# Patient Record
Sex: Male | Born: 1950 | Race: White | Hispanic: No | Smoking: Never smoker
Health system: Southern US, Community
[De-identification: ages and names within clinical notes are randomized; demographics above are authoritative.]

## PROBLEM LIST (undated history)

## (undated) DIAGNOSIS — I10 Essential (primary) hypertension: Secondary | ICD-10-CM

## (undated) DIAGNOSIS — F419 Anxiety disorder, unspecified: Secondary | ICD-10-CM

## (undated) HISTORY — DX: Anxiety disorder, unspecified: F41.9

---

## 2006-01-15 ENCOUNTER — Ambulatory Visit: Payer: Self-pay | Admitting: Unknown Physician Specialty

## 2006-08-10 ENCOUNTER — Ambulatory Visit: Payer: Self-pay | Admitting: Internal Medicine

## 2006-12-21 ENCOUNTER — Ambulatory Visit: Payer: Self-pay

## 2011-03-11 ENCOUNTER — Ambulatory Visit: Payer: Self-pay | Admitting: Surgery

## 2011-03-18 ENCOUNTER — Ambulatory Visit: Payer: Self-pay | Admitting: Surgery

## 2012-01-01 ENCOUNTER — Ambulatory Visit: Payer: Self-pay | Admitting: Unknown Physician Specialty

## 2014-02-26 ENCOUNTER — Ambulatory Visit: Payer: Self-pay

## 2014-04-13 ENCOUNTER — Ambulatory Visit: Payer: Self-pay | Admitting: Internal Medicine

## 2014-07-08 NOTE — Op Note (Signed)
PATIENT NAME:  Newman PiesSLEY, Bernabe B MR#:  [id number] DATE OF BIRTH:  10/02/1950  DATE OF PROCEDURE:  03/18/2011  PREOPERATIVE DIAGNOSIS: Left inguinal hernia.   POSTOPERATIVE DIAGNOSIS: Left inguinal hernia.   PROCEDURE: Left inguinal hernia repair.   SURGEON: Adella HareJ. Wilton Smith, MD  ANESTHESIA: General.   INDICATIONS: This 64 year old male has a history of pain and bulging in the left groin. He has a long-standing hydrocele. A left inguinal hernia was demonstrated on physical exam and repair recommended for definitive treatment.   DESCRIPTION OF PROCEDURE: The patient was placed on the operating table in the supine position under general anesthesia. The abdomen was prepared with ChloraPrep, draped in a sterile manner.   A left lower quadrant transversely oriented suprapubic incision was made, carried down through subcutaneous tissues. Several small bleeding points were cauterized. One traversing vein was divided between 4-0 chromic ligatures. Scarpa's fascia was incised. The external oblique aponeurosis was incised along the course of its fibers to open the external ring and expose the inguinal cord structures. The ilioinguinal nerve was seen coursing medially. Cremaster fibers were spread and also the cord structures were further mobilized and demonstrated a direct inguinal hernia. This was separated from the cord structures. The cremaster fibers were further spread exposing a cord lipoma but there was no indirect component of the hernia. Next, a circumferential dissection was made in the attenuated transversalis fascia which was excised. Next, the sac was inverted. The floor of the inguinal canal was repaired with a row of 0 Surgilon sutures suturing the edges of the transversalis fascia and also suturing the conjoined tendon to the shelving edge of the inguinal ligament completely obliterating the direct inguinal hernia defect. There was no tension on the repair. Next the two small cord lipomas were  dissected free from surrounding structures and suture ligated with 4-0 chromic individually and amputated. Did not need to send any tissue for pathology. Next, the cord structures and ilioinguinal nerve were replaced along the floor of the inguinal canal. Cut edges of the external oblique aponeurosis were closed with a running 4-0 Vicryl suture. The deep fascia superior and lateral to the repair site was infiltrated with 0.5% Sensorcaine with epinephrine, also subcutaneous tissues were infiltrated. Scarpa's fascia was closed with interrupted 4-0 Vicryl. The skin was closed with a running 4-0 chromic subcuticular suture and Dermabond.   The patient tolerated surgery satisfactorily and was then prepared for transfer to the recovery room.  ____________________________ Shela CommonsJ. Renda RollsWilton Smith, MD jws:cms D: 03/18/2011 08:46:50 ET T: 03/18/2011 12:41:56 ET JOB#: [id number]  cc: Adella HareJ. Wilton Smith, MD, <Dictator> Adella HareWILTON J SMITH MD ELECTRONICALLY SIGNED 04/15/2011 18:13

## 2015-04-04 ENCOUNTER — Other Ambulatory Visit: Payer: Self-pay | Admitting: Internal Medicine

## 2015-04-04 DIAGNOSIS — R911 Solitary pulmonary nodule: Secondary | ICD-10-CM

## 2015-04-12 ENCOUNTER — Ambulatory Visit
Admission: RE | Admit: 2015-04-12 | Discharge: 2015-04-12 | Disposition: A | Discharge status: Home / Self Care | Payer: BLUE CROSS/BLUE SHIELD | Source: Ambulatory Visit | Attending: Internal Medicine | Admitting: Internal Medicine

## 2015-04-12 DIAGNOSIS — N2889 Other specified disorders of kidney and ureter: Secondary | ICD-10-CM | POA: Insufficient documentation

## 2015-04-12 DIAGNOSIS — M4185 Other forms of scoliosis, thoracolumbar region: Secondary | ICD-10-CM | POA: Insufficient documentation

## 2015-04-12 DIAGNOSIS — R911 Solitary pulmonary nodule: Secondary | ICD-10-CM | POA: Insufficient documentation

## 2015-04-12 HISTORY — DX: Essential (primary) hypertension: I10

## 2015-04-12 MED ORDER — IOPAMIDOL (ISOVUE-300) INJECTION 61%: 75.0000 mL | Freq: Once | INTRAVENOUS | Status: DC | PRN

## 2015-04-12 MED ORDER — IOHEXOL 300 MG/ML  SOLN
75.0000 mL | Freq: Once | INTRAMUSCULAR | Status: AC | PRN
  Administered 2015-04-12: 75 mL via INTRAVENOUS

## 2015-04-16 ENCOUNTER — Other Ambulatory Visit: Payer: Self-pay | Admitting: Internal Medicine

## 2015-04-16 DIAGNOSIS — N2889 Other specified disorders of kidney and ureter: Secondary | ICD-10-CM

## 2015-05-06 ENCOUNTER — Ambulatory Visit
Admission: RE | Admit: 2015-05-06 | Discharge: 2015-05-06 | Disposition: A | Discharge status: Home / Self Care | Payer: BLUE CROSS/BLUE SHIELD | Source: Ambulatory Visit | Attending: Internal Medicine | Admitting: Internal Medicine

## 2015-05-06 DIAGNOSIS — N2889 Other specified disorders of kidney and ureter: Secondary | ICD-10-CM | POA: Diagnosis not present

## 2015-09-13 ENCOUNTER — Other Ambulatory Visit
Admission: RE | Admit: 2015-09-13 | Discharge: 2015-09-13 | Disposition: A | Discharge status: Home / Self Care | Payer: BLUE CROSS/BLUE SHIELD | Source: Ambulatory Visit | Attending: Student | Admitting: Student

## 2015-09-13 DIAGNOSIS — R197 Diarrhea, unspecified: Secondary | ICD-10-CM | POA: Insufficient documentation

## 2015-09-13 LAB — C DIFFICILE QUICK SCREEN W PCR REFLEX
C DIFFICILE (CDIFF) INTERP: NEGATIVE
C DIFFICILE (CDIFF) TOXIN: NEGATIVE
C Diff antigen: NEGATIVE

## 2015-09-14 LAB — MISC LABCORP TEST (SEND OUT): LABCORP TEST CODE: 183480

## 2016-04-29 ENCOUNTER — Ambulatory Visit: Payer: Self-pay | Admitting: Podiatry

## 2016-10-16 ENCOUNTER — Other Ambulatory Visit: Payer: Self-pay | Admitting: Internal Medicine

## 2016-10-16 DIAGNOSIS — M7989 Other specified soft tissue disorders: Secondary | ICD-10-CM

## 2016-10-22 ENCOUNTER — Ambulatory Visit
Admission: RE | Admit: 2016-10-22 | Discharge: 2016-10-22 | Disposition: A | Discharge status: Home / Self Care | Payer: BLUE CROSS/BLUE SHIELD | Source: Ambulatory Visit | Attending: Internal Medicine | Admitting: Internal Medicine

## 2016-10-22 DIAGNOSIS — M7989 Other specified soft tissue disorders: Secondary | ICD-10-CM | POA: Diagnosis not present

## 2017-02-23 ENCOUNTER — Other Ambulatory Visit: Payer: Self-pay | Admitting: Internal Medicine

## 2017-02-23 DIAGNOSIS — I712 Thoracic aortic aneurysm, without rupture, unspecified: Secondary | ICD-10-CM

## 2017-03-04 ENCOUNTER — Ambulatory Visit
Admission: RE | Admit: 2017-03-04 | Discharge: 2017-03-04 | Disposition: A | Discharge status: Home / Self Care | Payer: BLUE CROSS/BLUE SHIELD | Source: Ambulatory Visit | Attending: Internal Medicine | Admitting: Internal Medicine

## 2017-03-04 DIAGNOSIS — I712 Thoracic aortic aneurysm, without rupture, unspecified: Secondary | ICD-10-CM

## 2017-03-04 DIAGNOSIS — K8020 Calculus of gallbladder without cholecystitis without obstruction: Secondary | ICD-10-CM | POA: Insufficient documentation

## 2017-03-04 LAB — POCT I-STAT CREATININE: Creatinine, Ser: 1 mg/dL (ref 0.61–1.24)

## 2017-03-04 MED ORDER — IOPAMIDOL (ISOVUE-370) INJECTION 76%
75.0000 mL | Freq: Once | INTRAVENOUS | Status: AC | PRN
Start: 2017-03-04 — End: 2017-03-04
  Administered 2017-03-04: 75 mL via INTRAVENOUS

## 2017-07-20 DIAGNOSIS — Z860101 Personal history of adenomatous and serrated colon polyps: Secondary | ICD-10-CM | POA: Insufficient documentation

## 2017-07-20 DIAGNOSIS — Z86010 Personal history of colonic polyps: Secondary | ICD-10-CM | POA: Insufficient documentation

## 2017-11-02 DIAGNOSIS — I10 Essential (primary) hypertension: Secondary | ICD-10-CM | POA: Insufficient documentation

## 2018-05-06 ENCOUNTER — Other Ambulatory Visit: Payer: Self-pay | Admitting: Internal Medicine

## 2018-05-06 DIAGNOSIS — I712 Thoracic aortic aneurysm, without rupture, unspecified: Secondary | ICD-10-CM

## 2018-05-25 ENCOUNTER — Ambulatory Visit
Admission: RE | Admit: 2018-05-25 | Discharge: 2018-05-25 | Disposition: A | Discharge status: Home / Self Care | Payer: BLUE CROSS/BLUE SHIELD | Source: Ambulatory Visit | Attending: Internal Medicine | Admitting: Internal Medicine

## 2018-05-25 ENCOUNTER — Other Ambulatory Visit: Payer: Self-pay

## 2018-05-25 DIAGNOSIS — I712 Thoracic aortic aneurysm, without rupture, unspecified: Secondary | ICD-10-CM

## 2018-05-25 LAB — POCT I-STAT CREATININE: Creatinine, Ser: 0.9 mg/dL (ref 0.61–1.24)

## 2018-05-25 MED ORDER — IOPAMIDOL (ISOVUE-370) INJECTION 76%
100.0000 mL | Freq: Once | INTRAVENOUS | Status: AC | PRN
  Administered 2018-05-25: 100 mL via INTRAVENOUS

## 2018-05-25 MED ORDER — IOHEXOL 350 MG/ML SOLN: 100.0000 mL | Freq: Once | INTRAVENOUS | Status: DC | PRN

## 2018-05-27 ENCOUNTER — Other Ambulatory Visit: Payer: Self-pay | Admitting: Internal Medicine

## 2018-05-27 DIAGNOSIS — N2889 Other specified disorders of kidney and ureter: Secondary | ICD-10-CM

## 2018-06-06 ENCOUNTER — Other Ambulatory Visit: Payer: Self-pay

## 2018-06-06 ENCOUNTER — Ambulatory Visit
Admission: RE | Admit: 2018-06-06 | Discharge: 2018-06-06 | Disposition: A | Discharge status: Home / Self Care | Payer: BLUE CROSS/BLUE SHIELD | Source: Ambulatory Visit | Attending: Internal Medicine | Admitting: Internal Medicine

## 2018-06-06 DIAGNOSIS — N2889 Other specified disorders of kidney and ureter: Secondary | ICD-10-CM | POA: Insufficient documentation

## 2018-11-03 DIAGNOSIS — I10 Essential (primary) hypertension: Secondary | ICD-10-CM | POA: Diagnosis not present

## 2018-11-03 DIAGNOSIS — Z131 Encounter for screening for diabetes mellitus: Secondary | ICD-10-CM | POA: Diagnosis not present

## 2018-11-03 DIAGNOSIS — K5100 Ulcerative (chronic) pancolitis without complications: Secondary | ICD-10-CM | POA: Diagnosis not present

## 2018-11-03 DIAGNOSIS — E7800 Pure hypercholesterolemia, unspecified: Secondary | ICD-10-CM | POA: Diagnosis not present

## 2018-11-10 DIAGNOSIS — K5100 Ulcerative (chronic) pancolitis without complications: Secondary | ICD-10-CM | POA: Diagnosis not present

## 2018-11-14 ENCOUNTER — Other Ambulatory Visit: Payer: Self-pay

## 2018-11-14 DIAGNOSIS — K51019 Ulcerative (chronic) pancolitis with unspecified complications: Secondary | ICD-10-CM

## 2018-11-14 NOTE — Progress Notes (Signed)
Scott Bautista had a visit with Dawson Bills, NP at [facility] on 11/10/2018.  She requested the following labs:  CBC, BMP, HFP, Sed Rate, C-Reactive Protien & Quantiferon - TB Gold.  AMD

## 2018-11-16 LAB — BASIC METABOLIC PANEL
BUN/Creatinine Ratio: 20 (ref 10–24)
BUN: 18 mg/dL (ref 8–27)
CO2: 20 mmol/L (ref 20–29)
Calcium: 9.5 mg/dL (ref 8.6–10.2)
Chloride: 103 mmol/L (ref 96–106)
Creatinine, Ser: 0.9 mg/dL (ref 0.76–1.27)
GFR calc Af Amer: 102 mL/min/{1.73_m2} (ref >59)
GFR calc non Af Amer: 88 mL/min/{1.73_m2} (ref >59)
Glucose: 102 mg/dL — ABNORMAL HIGH (ref 65–99)
Potassium: 4.2 mmol/L (ref 3.5–5.2)
Sodium: 140 mmol/L (ref 134–144)

## 2018-11-16 LAB — CBC WITH DIFFERENTIAL/PLATELET
Basophils Absolute: 0 10*3/uL (ref 0.0–0.2)
Basos: 0 %
EOS (ABSOLUTE): 0.1 10*3/uL (ref 0.0–0.4)
Eos: 2 %
Hematocrit: 45.2 % (ref 37.5–51.0)
Hemoglobin: 15.6 g/dL (ref 13.0–17.7)
Immature Grans (Abs): 0 10*3/uL (ref 0.0–0.1)
Immature Granulocytes: 0 %
Lymphocytes Absolute: 2.6 10*3/uL (ref 0.7–3.1)
Lymphs: 37 %
MCH: 31.5 pg (ref 26.6–33.0)
MCHC: 34.5 g/dL (ref 31.5–35.7)
MCV: 91 fL (ref 79–97)
Monocytes Absolute: 0.6 10*3/uL (ref 0.1–0.9)
Monocytes: 9 %
Neutrophils Absolute: 3.6 10*3/uL (ref 1.4–7.0)
Neutrophils: 52 %
Platelets: 234 10*3/uL (ref 150–450)
RBC: 4.96 x10E6/uL (ref 4.14–5.80)
RDW: 13 % (ref 11.6–15.4)
WBC: 6.8 10*3/uL (ref 3.4–10.8)

## 2018-11-16 LAB — C-REACTIVE PROTEIN: CRP: 1 mg/L (ref 0–10)

## 2018-11-16 LAB — QUANTIFERON-TB GOLD PLUS
QuantiFERON Mitogen Value: >10 IU/mL
QuantiFERON Nil Value: 0.01 IU/mL
QuantiFERON TB1 Ag Value: 0.02 IU/mL
QuantiFERON TB2 Ag Value: 0.01 IU/mL
QuantiFERON-TB Gold Plus: NEGATIVE

## 2018-11-16 LAB — HEPATIC FUNCTION PANEL
ALT: 16 IU/L (ref 0–44)
AST: 21 IU/L (ref 0–40)
Albumin: 4.5 g/dL (ref 3.8–4.8)
Alkaline Phosphatase: 50 IU/L (ref 39–117)
Bilirubin Total: 1.2 mg/dL (ref 0.0–1.2)
Bilirubin, Direct: 0.27 mg/dL (ref 0.00–0.40)
Total Protein: 6.7 g/dL (ref 6.0–8.5)

## 2018-11-16 LAB — SEDIMENTATION RATE: Sed Rate: 20 mm/hr (ref 0–30)

## 2018-11-30 DIAGNOSIS — L570 Actinic keratosis: Secondary | ICD-10-CM | POA: Diagnosis not present

## 2018-11-30 DIAGNOSIS — Z09 Encounter for follow-up examination after completed treatment for conditions other than malignant neoplasm: Secondary | ICD-10-CM | POA: Diagnosis not present

## 2018-11-30 DIAGNOSIS — Z85828 Personal history of other malignant neoplasm of skin: Secondary | ICD-10-CM | POA: Diagnosis not present

## 2018-11-30 DIAGNOSIS — Z872 Personal history of diseases of the skin and subcutaneous tissue: Secondary | ICD-10-CM | POA: Diagnosis not present

## 2018-11-30 DIAGNOSIS — Z08 Encounter for follow-up examination after completed treatment for malignant neoplasm: Secondary | ICD-10-CM | POA: Diagnosis not present

## 2018-12-30 ENCOUNTER — Ambulatory Visit: Payer: Self-pay

## 2018-12-30 ENCOUNTER — Other Ambulatory Visit: Payer: Self-pay

## 2018-12-30 DIAGNOSIS — Z9229 Personal history of other drug therapy: Secondary | ICD-10-CM

## 2019-02-13 ENCOUNTER — Ambulatory Visit: Payer: Self-pay

## 2019-02-13 ENCOUNTER — Other Ambulatory Visit: Payer: Self-pay

## 2019-02-13 VITALS — BP 130/78

## 2019-02-13 DIAGNOSIS — Z0130 Encounter for examination of blood pressure without abnormal findings: Secondary | ICD-10-CM

## 2019-02-13 NOTE — Progress Notes (Signed)
Presents requesting blood pressure check.  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  AMD

## 2019-04-07 ENCOUNTER — Ambulatory Visit: Payer: Self-pay

## 2019-04-07 ENCOUNTER — Other Ambulatory Visit: Payer: Self-pay

## 2019-04-07 DIAGNOSIS — Z0000 Encounter for general adult medical examination without abnormal findings: Secondary | ICD-10-CM

## 2019-04-07 NOTE — Progress Notes (Signed)
Appt scheduled for mid February 2021 for annual physical with Dr. Aram Beecham of [facility]  Also, requested for lab results to be sent to Harmon Dun, PA-C of [facility].  AMD

## 2019-04-08 LAB — CMP12+LP+TP+TSH+6AC+PSA+CBC…
ALT: 15 IU/L (ref 0–44)
AST: 25 IU/L (ref 0–40)
Albumin/Globulin Ratio: 2 (ref 1.2–2.2)
Albumin: 4.5 g/dL (ref 3.8–4.8)
Alkaline Phosphatase: 50 IU/L (ref 39–117)
BUN/Creatinine Ratio: 18 (ref 10–24)
BUN: 18 mg/dL (ref 8–27)
Basophils Absolute: 0 10*3/uL (ref 0.0–0.2)
Basos: 0 %
Bilirubin Total: 1.3 mg/dL — ABNORMAL HIGH (ref 0.0–1.2)
Chloride: 103 mmol/L (ref 96–106)
Chol/HDL Ratio: 2.9 ratio (ref 0.0–5.0)
Cholesterol, Total: 149 mg/dL (ref 100–199)
Creatinine, Ser: 1.02 mg/dL (ref 0.76–1.27)
EOS (ABSOLUTE): 0.2 10*3/uL (ref 0.0–0.4)
Eos: 3 %
Estimated CHD Risk: <0.5 times avg. (ref 0.0–1.0)
Free Thyroxine Index: 2.1 (ref 1.2–4.9)
GFR calc Af Amer: 88 mL/min/{1.73_m2} (ref >59)
GFR calc non Af Amer: 76 mL/min/{1.73_m2} (ref >59)
GGT: 15 IU/L (ref 0–65)
Globulin, Total: 2.3 g/dL (ref 1.5–4.5)
Glucose: 90 mg/dL (ref 65–99)
HDL: 52 mg/dL (ref >39)
Hematocrit: 49.2 % (ref 37.5–51.0)
Immature Grans (Abs): 0 10*3/uL (ref 0.0–0.1)
Immature Granulocytes: 0 %
Iron: 117 ug/dL (ref 38–169)
LDH: 157 IU/L (ref 121–224)
LDL Chol Calc (NIH): 75 mg/dL (ref 0–99)
Lymphocytes Absolute: 2.4 10*3/uL (ref 0.7–3.1)
Lymphs: 40 %
MCH: 29.9 pg (ref 26.6–33.0)
MCHC: 32.5 g/dL (ref 31.5–35.7)
MCV: 92 fL (ref 79–97)
Monocytes Absolute: 0.4 10*3/uL (ref 0.1–0.9)
Monocytes: 7 %
Neutrophils Absolute: 3 10*3/uL (ref 1.4–7.0)
Neutrophils: 50 %
Phosphorus: 3.2 mg/dL (ref 2.8–4.1)
Platelets: 260 10*3/uL (ref 150–450)
Potassium: 4.5 mmol/L (ref 3.5–5.2)
Prostate Specific Ag, Serum: 0.3 ng/mL (ref 0.0–4.0)
RBC: 5.36 x10E6/uL (ref 4.14–5.80)
RDW: 13.1 % (ref 11.6–15.4)
Sodium: 142 mmol/L (ref 134–144)
T3 Uptake Ratio: 29 % (ref 24–39)
T4, Total: 7.4 ug/dL (ref 4.5–12.0)
TSH: 0.87 u[IU]/mL (ref 0.450–4.500)
Total Protein: 6.8 g/dL (ref 6.0–8.5)
Triglycerides: 127 mg/dL (ref 0–149)
Uric Acid: 5.4 mg/dL (ref 3.8–8.4)
VLDL Cholesterol Cal: 22 mg/dL (ref 5–40)
WBC: 6 10*3/uL (ref 3.4–10.8)

## 2019-04-08 LAB — URINALYSIS, ROUTINE W REFLEX MICROSCOPIC
Bilirubin, UA: NEGATIVE
Glucose, UA: NEGATIVE
Ketones, UA: NEGATIVE
Leukocytes,UA: NEGATIVE
Nitrite, UA: NEGATIVE
Protein,UA: NEGATIVE
RBC, UA: NEGATIVE
Specific Gravity, UA: 1.019 (ref 1.005–1.030)
Urobilinogen, Ur: 0.2 mg/dL (ref 0.2–1.0)
pH, UA: 6.5 (ref 5.0–7.5)

## 2019-04-08 LAB — CMP12+LP+TP+TSH+6AC+PSA+CBC?
Calcium: 9.1 mg/dL (ref 8.6–10.2)
Hemoglobin: 16 g/dL (ref 13.0–17.7)

## 2019-04-08 LAB — HGB A1C W/O EAG: Hgb A1c MFr Bld: 5.6 % (ref 4.8–5.6)

## 2019-05-11 DIAGNOSIS — I10 Essential (primary) hypertension: Secondary | ICD-10-CM | POA: Diagnosis not present

## 2019-05-11 DIAGNOSIS — E7800 Pure hypercholesterolemia, unspecified: Secondary | ICD-10-CM | POA: Diagnosis not present

## 2019-05-11 DIAGNOSIS — Z0000 Encounter for general adult medical examination without abnormal findings: Secondary | ICD-10-CM | POA: Diagnosis not present

## 2019-05-11 DIAGNOSIS — K5100 Ulcerative (chronic) pancolitis without complications: Secondary | ICD-10-CM | POA: Diagnosis not present

## 2019-05-31 ENCOUNTER — Other Ambulatory Visit (INDEPENDENT_AMBULATORY_CARE_PROVIDER_SITE_OTHER): Payer: Self-pay | Admitting: Nurse Practitioner

## 2019-05-31 DIAGNOSIS — I8390 Asymptomatic varicose veins of unspecified lower extremity: Secondary | ICD-10-CM

## 2019-06-02 ENCOUNTER — Other Ambulatory Visit: Payer: Self-pay

## 2019-06-02 ENCOUNTER — Encounter (INDEPENDENT_AMBULATORY_CARE_PROVIDER_SITE_OTHER): Payer: Self-pay | Admitting: Nurse Practitioner

## 2019-06-02 ENCOUNTER — Ambulatory Visit (INDEPENDENT_AMBULATORY_CARE_PROVIDER_SITE_OTHER): Payer: 59 | Admitting: Nurse Practitioner

## 2019-06-02 ENCOUNTER — Ambulatory Visit (INDEPENDENT_AMBULATORY_CARE_PROVIDER_SITE_OTHER): Payer: 59

## 2019-06-02 VITALS — BP 144/95 | HR 68 | Ht 75.0 in | Wt 225.0 lb

## 2019-06-02 DIAGNOSIS — M1990 Unspecified osteoarthritis, unspecified site: Secondary | ICD-10-CM | POA: Insufficient documentation

## 2019-06-02 DIAGNOSIS — E785 Hyperlipidemia, unspecified: Secondary | ICD-10-CM | POA: Insufficient documentation

## 2019-06-02 DIAGNOSIS — I83812 Varicose veins of left lower extremities with pain: Secondary | ICD-10-CM

## 2019-06-02 DIAGNOSIS — M5137 Other intervertebral disc degeneration, lumbosacral region: Secondary | ICD-10-CM | POA: Insufficient documentation

## 2019-06-02 DIAGNOSIS — I10 Essential (primary) hypertension: Secondary | ICD-10-CM

## 2019-06-02 DIAGNOSIS — Z8669 Personal history of other diseases of the nervous system and sense organs: Secondary | ICD-10-CM | POA: Insufficient documentation

## 2019-06-02 DIAGNOSIS — N433 Hydrocele, unspecified: Secondary | ICD-10-CM | POA: Insufficient documentation

## 2019-06-02 DIAGNOSIS — I8390 Asymptomatic varicose veins of unspecified lower extremity: Secondary | ICD-10-CM | POA: Diagnosis not present

## 2019-06-02 DIAGNOSIS — Z8719 Personal history of other diseases of the digestive system: Secondary | ICD-10-CM | POA: Insufficient documentation

## 2019-06-02 DIAGNOSIS — K5190 Ulcerative colitis, unspecified, without complications: Secondary | ICD-10-CM | POA: Insufficient documentation

## 2019-06-07 ENCOUNTER — Encounter (INDEPENDENT_AMBULATORY_CARE_PROVIDER_SITE_OTHER): Payer: Self-pay | Admitting: Nurse Practitioner

## 2019-06-07 NOTE — Progress Notes (Signed)
SUBJECTIVE:  Patient ID: Scott Bautista, male    DOB: 1950-07-14, 69 y.o.   MRN: [id number] Chief Complaint  Patient presents with  . New Patient (Initial Visit)    Varicose veins BLE VEN Reflux LS    HPI  Scott Bautista is a 69 y.o. male The patient is seen for evaluation of symptomatic varicose veins of the left lower extremity. The patient relates burning and stinging which worsened steadily throughout the course of the day, particularly with standing. The patient also notes an aching and throbbing pain over the varicosities, particularly with prolonged dependent positions. The symptoms are significantly improved with elevation.  The patient also notes that during hot weather the symptoms are greatly intensified. The patient states the pain from the varicose veins interferes with work, daily exercise, shopping and household maintenance. At this point, the symptoms are persistent and severe enough that they're having a negative impact on lifestyle and are interfering with daily activities.  Previously an approximately 1975 the patient had the vein stripped from his right lower extremity.  The patient has previously had sclerotherapy done to his left lower extremity approximately 12 to 13 years ago per previous records.  There is no history of DVT, PE or superficial thrombophlebitis. There is no history of ulceration or hemorrhage. The patient denies a significant family history of varicose veins.   The patient has worn graduated compression in the past and currently still does. At the present time the patient has been using over-the-counter analgesics.  Today the patient underwent noninvasive studies.  The patient has evidence of reflux in the common femoral vein as well as mid femoral vein of the left lower extremity.  The left lower extremity has evidence of reflux in the great saphenous vein at the saphenofemoral junction extending to the knee.  The vein diameters range from 0.48 cm to  0.81 cm.  There is no evidence of DVT or superficial venous thrombosis in the left lower extremity.   Past Medical History:  Diagnosis Date  . Hypertension     History reviewed. No pertinent surgical history.  Social History   Socioeconomic History  . Marital status: Legally Separated    Spouse name: Not on file  . Number of children: Not on file  . Years of education: Not on file  . Highest education level: Not on file  Occupational History  . Not on file  Tobacco Use  . Smoking status: Never Smoker  . Smokeless tobacco: Never Used  Substance and Sexual Activity  . Alcohol use: Not on file  . Drug use: Not on file  . Sexual activity: Not on file  Other Topics Concern  . Not on file  Social History Narrative  . Not on file   Social Determinants of Health   Financial Resource Strain:   . Difficulty of Paying Living Expenses:   Food Insecurity:   . Worried About Charity fundraiser in the Last Year:   . Arboriculturist in the Last Year:   Transportation Needs:   . Film/video editor (Medical):   Marland Kitchen Lack of Transportation (Non-Medical):   Physical Activity:   . Days of Exercise per Week:   . Minutes of Exercise per Session:   Stress:   . Feeling of Stress :   Social Connections:   . Frequency of Communication with Friends and Family:   . Frequency of Social Gatherings with Friends and Family:   . Attends Religious Services:   .  Active Member of Clubs or Organizations:   . Attends Banker Meetings:   Marland Kitchen Marital Status:   Intimate Partner Violence:   . Fear of Current or Ex-Partner:   . Emotionally Abused:   Marland Kitchen Physically Abused:   . Sexually Abused:     History reviewed. No pertinent family history.  Allergies  Allergen Reactions  . Codeine Other (See Comments)    Constipation    . Rosuvastatin     Other reaction(s): Muscle Pain     Review of Systems   Review of Systems: Negative Unless Checked Constitutional: [] Weight loss   [] Fever  [] Chills Cardiac: [] Chest pain   []  Atrial Fibrillation  [] Palpitations   [] Shortness of breath when laying flat   [] Shortness of breath with exertion. [] Shortness of breath at rest Vascular:  [] Pain in legs with walking   [] Pain in legs with standing [] Pain in legs when laying flat   [] Claudication    [] Pain in feet when laying flat    [] History of DVT   [] Phlebitis   [] Swelling in legs   [x] Varicose veins   [] Non-healing ulcers Pulmonary:   [] Uses home oxygen   [] Productive cough   [] Hemoptysis   [] Wheeze  [] COPD   [] Asthma Neurologic:  [] Dizziness   [] Seizures  [] Blackouts [] History of stroke   [] History of TIA  [] Aphasia   [] Temporary Blindness   [] Weakness or numbness in arm   [] Weakness or numbness in leg Musculoskeletal:   [] Joint swelling   [] Joint pain   [] Low back pain  []  History of Knee Replacement [x] Arthritis [] back Surgeries  []  Spinal Stenosis    Hematologic:  [] Easy bruising  [] Easy bleeding   [] Hypercoagulable state   [] Anemic Gastrointestinal:  [] Diarrhea   [] Vomiting  [] Gastroesophageal reflux/heartburn   [] Difficulty swallowing. [] Abdominal pain Genitourinary:  [] Chronic kidney disease   [] Difficult urination  [] Anuric   [] Blood in urine [] Frequent urination  [] Burning with urination   [] Hematuria Skin:  [] Rashes   [] Ulcers [] Wounds Psychological:  [] History of anxiety   []  History of major depression  []  Memory Difficulties      OBJECTIVE:   Physical Exam  BP (!) 144/95   Pulse 68   Ht 6\' 3"  (1.905 m)   Wt 225 lb (102.1 kg)   BMI 28.12 kg/m   Gen: WD/WN, NAD Head: [place]/AT, No temporalis wasting.  Ear/Nose/Throat: Hearing grossly intact, nares w/o erythema or drainage Eyes: PER, EOMI, sclera nonicteric.  Neck: Supple, no masses.  No JVD.  Pulmonary:  Good air movement, no use of accessory muscles.  Cardiac: RRR Vascular:  Extensive prominent varicosity on the right lower extremity approximately 4 to 6 mm Vessel Right Left  Radial Palpable Palpable    Dorsalis Pedis Palpable Palpable  Posterior Tibial Palpable Palpable   Gastrointestinal: soft, non-distended. No guarding/no peritoneal signs.  Musculoskeletal: M/S 5/5 throughout.  No deformity or atrophy.  Neurologic: Pain and light touch intact in extremities.  Symmetrical.  Speech is fluent. Motor exam as listed above. Psychiatric: Judgment intact, Mood & affect appropriate for pt's clinical situation. Dermatologic: No Venous rashes. No Ulcers Noted.  No changes consistent with cellulitis. Lymph : No Cervical lymphadenopathy, no lichenification or skin changes of chronic lymphedema.        ASSESSMENT AND PLAN:  1. Varicose veins of leg with pain, left Recommend  I have reviewed my previous  discussion with the patient regarding  varicose veins and why they cause symptoms. Patient will continue  wearing graduated compression stockings class 1  on a daily basis, beginning first thing in the morning and removing them in the evening.    In addition, behavioral modification including elevation during the day was again discussed and this will continue.  The patient has utilized over the counter pain medications and has been exercising.  However, at this time conservative therapy has not alleviated the patient's symptoms of leg pain and swelling  Recommend: laser ablation of the  left great saphenous veins to eliminate the symptoms of pain and swelling of the lower extremities caused by the severe superficial venous reflux disease.   2. HTN, goal below 140/80 Continue antihypertensive medications as already ordered, these medications have been reviewed and there are no changes at this time.    Current Outpatient Medications on File Prior to Visit  Medication Sig Dispense Refill  . acetaminophen (TYLENOL) 500 MG tablet Take by mouth.    . ALPRAZolam (XANAX) 0.5 MG tablet Take by mouth.    Marland Kitchen aspirin (ASPIRIN 81) 81 MG chewable tablet     . Coenzyme Q10 200 MG capsule Take by mouth.     Marland Kitchen HUMIRA PEN 40 MG/0.8ML PNKT     . metoprolol succinate (TOPROL-XL) 50 MG 24 hr tablet Take by mouth.    . Multiple Vitamins-Minerals (CENTRUM SILVER 50+MEN) TABS Take by mouth.    . tadalafil (CIALIS) 20 MG tablet Take by mouth.     No current facility-administered medications on file prior to visit.    There are no Patient Instructions on file for this visit. No follow-ups on file.   Georgiana Spinner, NP  This note was completed with Office manager.  Any errors are purely unintentional.

## 2019-06-14 DIAGNOSIS — K5100 Ulcerative (chronic) pancolitis without complications: Secondary | ICD-10-CM | POA: Diagnosis not present

## 2019-07-14 ENCOUNTER — Other Ambulatory Visit: Payer: Self-pay

## 2019-07-14 ENCOUNTER — Ambulatory Visit (INDEPENDENT_AMBULATORY_CARE_PROVIDER_SITE_OTHER): Payer: 59 | Admitting: Vascular Surgery

## 2019-07-14 DIAGNOSIS — I83812 Varicose veins of left lower extremities with pain: Secondary | ICD-10-CM

## 2019-07-14 NOTE — Progress Notes (Signed)
  Scott Bautista is a 69 y.o. male who presents with symptomatic venous reflux  Past Medical History:  Diagnosis Date  . Hypertension     No past surgical history on file.   Current Outpatient Medications:  .  acetaminophen (TYLENOL) 500 MG tablet, Take by mouth., Disp: , Rfl:  .  ALPRAZolam (XANAX) 0.5 MG tablet, Take by mouth., Disp: , Rfl:  .  aspirin (ASPIRIN 81) 81 MG chewable tablet, , Disp: , Rfl:  .  Coenzyme Q10 200 MG capsule, Take by mouth., Disp: , Rfl:  .  HUMIRA PEN 40 MG/0.8ML PNKT, , Disp: , Rfl:  .  metoprolol succinate (TOPROL-XL) 50 MG 24 hr tablet, Take by mouth., Disp: , Rfl:  .  Multiple Vitamins-Minerals (CENTRUM SILVER 50+MEN) TABS, Take by mouth., Disp: , Rfl:  .  tadalafil (CIALIS) 20 MG tablet, Take by mouth., Disp: , Rfl:   Allergies  Allergen Reactions  . Codeine Other (See Comments)    Constipation    . Rosuvastatin     Other reaction(s): Muscle Pain     Varicose veins of leg with pain, left   PLAN: The patient's left lower extremity was sterilely prepped and draped. The ultrasound machine was used to visualize the saphenous vein throughout its course. A segment in the lower thigh that had reopened was selected for access. The saphenous vein was accessed with minimal difficulty using ultrasound guidance with a micropuncture needle. A 0.018 wire was then placed beyond the saphenofemoral junction and the needle was removed. The 65 cm sheath was then placed over the wire and the wire and dilator were removed. The laser fiber was then placed through the sheath and its tip was placed approximately 4-5 centimeters below the saphenofemoral junction. Tumescent anesthesia was then created with a dilute lidocaine solution. Laser energy was then delivered with constant withdrawal of the sheath and laser fiber. Approximately 1036 joules of energy were delivered over a length of 27 centimeters using a 1470 Hz VenaCure machine at 7 W. Sterile dressings were placed.  The patient tolerated the procedure well without obvious complications.   Follow-up in 1 week with post-laser duplex.

## 2019-07-17 ENCOUNTER — Other Ambulatory Visit: Payer: Self-pay

## 2019-07-17 ENCOUNTER — Ambulatory Visit (INDEPENDENT_AMBULATORY_CARE_PROVIDER_SITE_OTHER): Payer: 59

## 2019-07-17 DIAGNOSIS — I83812 Varicose veins of left lower extremities with pain: Secondary | ICD-10-CM | POA: Diagnosis not present

## 2019-07-17 NOTE — Progress Notes (Signed)
Pt wants to know if exposed veins will diminish over time.

## 2019-08-01 DIAGNOSIS — K5100 Ulcerative (chronic) pancolitis without complications: Secondary | ICD-10-CM | POA: Diagnosis not present

## 2019-08-09 ENCOUNTER — Other Ambulatory Visit: Payer: 59

## 2019-08-15 ENCOUNTER — Other Ambulatory Visit: Payer: Self-pay

## 2019-08-15 ENCOUNTER — Encounter (INDEPENDENT_AMBULATORY_CARE_PROVIDER_SITE_OTHER): Payer: Self-pay | Admitting: Nurse Practitioner

## 2019-08-15 ENCOUNTER — Ambulatory Visit (INDEPENDENT_AMBULATORY_CARE_PROVIDER_SITE_OTHER): Payer: 59 | Admitting: Nurse Practitioner

## 2019-08-15 VITALS — BP 141/88 | HR 59 | Resp 16 | Ht 75.0 in | Wt 225.2 lb

## 2019-08-15 DIAGNOSIS — I83812 Varicose veins of left lower extremities with pain: Secondary | ICD-10-CM

## 2019-08-15 DIAGNOSIS — M1712 Unilateral primary osteoarthritis, left knee: Secondary | ICD-10-CM | POA: Diagnosis not present

## 2019-08-17 ENCOUNTER — Encounter (INDEPENDENT_AMBULATORY_CARE_PROVIDER_SITE_OTHER): Payer: Self-pay | Admitting: Nurse Practitioner

## 2019-08-17 NOTE — Progress Notes (Signed)
Subjective:    Patient ID: Scott Bautista, male    DOB: 1950-04-13, 69 y.o.   MRN: [id number] Chief Complaint  Patient presents with  . Follow-up    4week post laser    The patient returns to the office for followup status post laser ablation of the left great saphenous vein on 07/14/2019. The patient notes multiple residual varicosities bilaterally which continued to hurt with dependent positions and remained tender to palpation. The patient's swelling is improved from preoperative status. The patient continues to wear graduated compression stockings on a daily basis but these are not eliminating the pain and discomfort. The patient continues to use over-the-counter anti-inflammatory medications to treat the pain and related symptoms but this has not given the patient relief. The patient notes the pain in the lower extremities is causing problems with daily exercise, problems at work and even with household activities such as preparing meals and doing dishes.  The patient is otherwise done well and there have been no complications related to the laser procedure or interval changes in the patient's overall   Venous ultrasound post laser shows successful laser ablation of the left great saphenous vein, no DVT identified.   Review of Systems  Musculoskeletal:       Tenderness w/varicosities  All other systems reviewed and are negative.      Objective:   Physical Exam Vitals reviewed.  HENT:     Head: Normocephalic.  Cardiovascular:     Pulses: Normal pulses.     Comments: Prominent varicosities on the left lower extremity about 3-67mm  Pulmonary:     Effort: Pulmonary effort is normal.     Breath sounds: Normal breath sounds.  Neurological:     Mental Status: He is alert and oriented to person, place, and time.  Psychiatric:        Mood and Affect: Mood normal.        Behavior: Behavior normal.        Thought Content: Thought content normal.        Judgment: Judgment normal.      BP (!) 141/88 (BP Location: Right Arm)   Pulse (!) 59   Resp 16   Ht 6\' 3"  (1.905 m)   Wt 225 lb 3.2 oz (102.2 kg)   BMI 28.15 kg/m   Past Medical History:  Diagnosis Date  . Hypertension     Social History   Socioeconomic History  . Marital status: Legally Separated    Spouse name: Not on file  . Number of children: Not on file  . Years of education: Not on file  . Highest education level: Not on file  Occupational History  . Not on file  Tobacco Use  . Smoking status: Never Smoker  . Smokeless tobacco: Never Used  Substance and Sexual Activity  . Alcohol use: Not on file  . Drug use: Not on file  . Sexual activity: Not on file  Other Topics Concern  . Not on file  Social History Narrative  . Not on file   Social Determinants of Health   Financial Resource Strain:   . Difficulty of Paying Living Expenses:   Food Insecurity:   . Worried About in the Last Year:   . Programme researcher, broadcasting/film/video in the Last Year:   Transportation Needs:   . Barista (Medical):   Freight forwarder Lack of Transportation (Non-Medical):   Physical Activity:   . Days of Exercise per Week:   .  Minutes of Exercise per Session:   Stress:   . Feeling of Stress :   Social Connections:   . Frequency of Communication with Friends and Family:   . Frequency of Social Gatherings with Friends and Family:   . Attends Religious Services:   . Active Member of Clubs or Organizations:   . Attends Archivist Meetings:   Marland Kitchen Marital Status:   Intimate Partner Violence:   . Fear of Current or Ex-Partner:   . Emotionally Abused:   Marland Kitchen Physically Abused:   . Sexually Abused:     No past surgical history on file.  No family history on file.  Allergies  Allergen Reactions  . Codeine Other (See Comments)    Constipation    . Rosuvastatin     Other reaction(s): Muscle Pain       Assessment & Plan:   1. Varicose veins of leg with pain, left Recommend:  The patient  has had successful ablation of the previously incompetent saphenous venous system but still has persistent symptoms of pain and swelling that are having a negative impact on daily life and daily activities.  Patient should undergo foam injection sclerotherapy to treat the residual varicosities of the left lower extremity.  The risks, benefits and alternative therapies were reviewed in detail with the patient.  All questions were answered.  The patient agrees to proceed with sclerotherapy at their convenience.  The patient will continue wearing the graduated compression stockings and using the over-the-counter pain medications to treat her symptoms.       2. Primary osteoarthritis of left knee Continue NSAID medications as already ordered, these medications have been reviewed and there are no changes at this time.  Continued activity and therapy was stressed.    Current Outpatient Medications on File Prior to Visit  Medication Sig Dispense Refill  . acetaminophen (TYLENOL) 500 MG tablet Take by mouth.    . ALPRAZolam (XANAX) 0.5 MG tablet Take by mouth at bedtime as needed.     Marland Kitchen aspirin (ASPIRIN 81) 81 MG chewable tablet     . Coenzyme Q10 200 MG capsule Take by mouth.    Marland Kitchen HUMIRA PEN 40 MG/0.8ML PNKT     . metoprolol succinate (TOPROL-XL) 50 MG 24 hr tablet Take by mouth.    . Multiple Vitamins-Minerals (CENTRUM SILVER 50+MEN) TABS Take by mouth.    . tadalafil (CIALIS) 20 MG tablet Take by mouth.     No current facility-administered medications on file prior to visit.    There are no Patient Instructions on file for this visit. No follow-ups on file.   Kris Hartmann, NP

## 2019-09-22 NOTE — Progress Notes (Signed)
Labs requested by Joyice Faster, PA of [facility]. Faxed order for labs in paper chart in clinic. P:  [phone] F:  [phone]  Labs results need to be faxed to Joyice Faster, PA.  AMD

## 2019-09-25 ENCOUNTER — Other Ambulatory Visit: Payer: Self-pay

## 2019-09-25 ENCOUNTER — Other Ambulatory Visit: Payer: 59

## 2019-09-25 DIAGNOSIS — K5100 Ulcerative (chronic) pancolitis without complications: Secondary | ICD-10-CM

## 2019-09-26 LAB — CMP12+LP+TP+TSH+6AC+CBC/D/PLT
ALT: 13 IU/L (ref 0–44)
AST: 22 IU/L (ref 0–40)
Albumin/Globulin Ratio: 1.7 (ref 1.2–2.2)
Albumin: 4.6 g/dL (ref 3.8–4.8)
Alkaline Phosphatase: 60 IU/L (ref 48–121)
BUN/Creatinine Ratio: 14 (ref 10–24)
BUN: 15 mg/dL (ref 8–27)
Basophils Absolute: 0 10*3/uL (ref 0.0–0.2)
Basos: 0 %
Bilirubin Total: 1.1 mg/dL (ref 0.0–1.2)
Calcium: 9.5 mg/dL (ref 8.6–10.2)
Chloride: 100 mmol/L (ref 96–106)
Chol/HDL Ratio: 5.5 ratio — ABNORMAL HIGH (ref 0.0–5.0)
Cholesterol, Total: 247 mg/dL — ABNORMAL HIGH (ref 100–199)
Creatinine, Ser: 1.08 mg/dL (ref 0.76–1.27)
EOS (ABSOLUTE): 0.3 10*3/uL (ref 0.0–0.4)
Eos: 5 %
Estimated CHD Risk: 1.2 times avg. — ABNORMAL HIGH (ref 0.0–1.0)
Free Thyroxine Index: 2.2 (ref 1.2–4.9)
GFR calc Af Amer: 81 mL/min/{1.73_m2} (ref >59)
GFR calc non Af Amer: 70 mL/min/{1.73_m2} (ref >59)
GGT: 16 IU/L (ref 0–65)
Globulin, Total: 2.7 g/dL (ref 1.5–4.5)
Glucose: 95 mg/dL (ref 65–99)
HDL: 45 mg/dL (ref >39)
Hematocrit: 49.2 % (ref 37.5–51.0)
Hemoglobin: 16.8 g/dL (ref 13.0–17.7)
Immature Grans (Abs): 0 10*3/uL (ref 0.0–0.1)
Immature Granulocytes: 0 %
Iron: 113 ug/dL (ref 38–169)
LDH: 158 IU/L (ref 121–224)
LDL Chol Calc (NIH): 152 mg/dL — ABNORMAL HIGH (ref 0–99)
Lymphocytes Absolute: 2.6 10*3/uL (ref 0.7–3.1)
Lymphs: 36 %
MCH: 31.5 pg (ref 26.6–33.0)
MCHC: 34.1 g/dL (ref 31.5–35.7)
MCV: 92 fL (ref 79–97)
Monocytes Absolute: 0.5 10*3/uL (ref 0.1–0.9)
Monocytes: 8 %
Neutrophils Absolute: 3.6 10*3/uL (ref 1.4–7.0)
Neutrophils: 51 %
Phosphorus: 3.4 mg/dL (ref 2.8–4.1)
Platelets: 255 10*3/uL (ref 150–450)
Potassium: 4.6 mmol/L (ref 3.5–5.2)
RBC: 5.33 x10E6/uL (ref 4.14–5.80)
RDW: 13.3 % (ref 11.6–15.4)
Sodium: 141 mmol/L (ref 134–144)
T3 Uptake Ratio: 27 % (ref 24–39)
T4, Total: 8.1 ug/dL (ref 4.5–12.0)
TSH: 1.46 u[IU]/mL (ref 0.450–4.500)
Total Protein: 7.3 g/dL (ref 6.0–8.5)
Triglycerides: 271 mg/dL — ABNORMAL HIGH (ref 0–149)
Uric Acid: 5.9 mg/dL (ref 3.8–8.4)
VLDL Cholesterol Cal: 50 mg/dL — ABNORMAL HIGH (ref 5–40)
WBC: 7.1 10*3/uL (ref 3.4–10.8)

## 2019-10-06 ENCOUNTER — Ambulatory Visit (INDEPENDENT_AMBULATORY_CARE_PROVIDER_SITE_OTHER): Payer: 59 | Admitting: Vascular Surgery

## 2019-10-19 DIAGNOSIS — M1712 Unilateral primary osteoarthritis, left knee: Secondary | ICD-10-CM | POA: Diagnosis not present

## 2019-10-19 DIAGNOSIS — M25562 Pain in left knee: Secondary | ICD-10-CM | POA: Diagnosis not present

## 2019-11-09 DIAGNOSIS — I10 Essential (primary) hypertension: Secondary | ICD-10-CM | POA: Diagnosis not present

## 2019-11-09 DIAGNOSIS — E7800 Pure hypercholesterolemia, unspecified: Secondary | ICD-10-CM | POA: Diagnosis not present

## 2019-11-09 DIAGNOSIS — Z79899 Other long term (current) drug therapy: Secondary | ICD-10-CM | POA: Diagnosis not present

## 2019-11-09 DIAGNOSIS — K5100 Ulcerative (chronic) pancolitis without complications: Secondary | ICD-10-CM | POA: Diagnosis not present

## 2020-01-04 DIAGNOSIS — H524 Presbyopia: Secondary | ICD-10-CM | POA: Diagnosis not present

## 2020-01-19 ENCOUNTER — Ambulatory Visit: Payer: Self-pay

## 2020-01-19 DIAGNOSIS — Z23 Encounter for immunization: Secondary | ICD-10-CM

## 2020-02-02 ENCOUNTER — Other Ambulatory Visit: Payer: Self-pay

## 2020-02-02 ENCOUNTER — Ambulatory Visit: Payer: Self-pay

## 2020-02-02 VITALS — BP 140/90

## 2020-02-02 DIAGNOSIS — Z0130 Encounter for examination of blood pressure without abnormal findings: Secondary | ICD-10-CM

## 2020-02-02 DIAGNOSIS — K5100 Ulcerative (chronic) pancolitis without complications: Secondary | ICD-10-CM | POA: Diagnosis not present

## 2020-02-02 NOTE — Progress Notes (Signed)
Presents to [facility] for BP check.    States he just left another physician office & they had 155/99.  BP checked manually in left arm using a a large BP cuff.  Reading 140/90.  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

## 2020-02-13 DIAGNOSIS — Z23 Encounter for immunization: Secondary | ICD-10-CM | POA: Diagnosis not present

## 2020-02-13 DIAGNOSIS — K5190 Ulcerative colitis, unspecified, without complications: Secondary | ICD-10-CM | POA: Diagnosis not present

## 2020-02-13 DIAGNOSIS — M25562 Pain in left knee: Secondary | ICD-10-CM | POA: Diagnosis not present

## 2020-02-13 DIAGNOSIS — I10 Essential (primary) hypertension: Secondary | ICD-10-CM | POA: Diagnosis not present

## 2020-02-22 ENCOUNTER — Other Ambulatory Visit: Payer: Self-pay

## 2020-02-22 DIAGNOSIS — Z1152 Encounter for screening for COVID-19: Secondary | ICD-10-CM

## 2020-02-22 NOTE — Progress Notes (Signed)
Presents to [facility] of outside specimen collection for covid screen.  States on Monday (02/19/20) he had cold S/Sx - sore throat, cough & congestion.  States he has a cold this time of year every year & S/Sx have almost resolved.  Exposure - 02/16/20  Vaccinated  Has Mychart  AMD

## 2020-02-23 LAB — NOVEL CORONAVIRUS, NAA: SARS-CoV-2, NAA: NOT DETECTED

## 2020-02-23 LAB — SARS-COV-2, NAA 2 DAY TAT

## 2020-03-06 ENCOUNTER — Other Ambulatory Visit: Payer: Self-pay

## 2020-03-06 DIAGNOSIS — Z1152 Encounter for screening for COVID-19: Secondary | ICD-10-CM

## 2020-03-07 DIAGNOSIS — J40 Bronchitis, not specified as acute or chronic: Secondary | ICD-10-CM | POA: Diagnosis not present

## 2020-03-07 LAB — SARS-COV-2, NAA 2 DAY TAT

## 2020-03-07 LAB — NOVEL CORONAVIRUS, NAA: SARS-CoV-2, NAA: NOT DETECTED

## 2020-03-18 ENCOUNTER — Other Ambulatory Visit: Payer: Self-pay

## 2020-03-18 DIAGNOSIS — R059 Cough, unspecified: Secondary | ICD-10-CM | POA: Diagnosis not present

## 2020-03-18 DIAGNOSIS — Z1152 Encounter for screening for COVID-19: Secondary | ICD-10-CM

## 2020-03-18 DIAGNOSIS — R053 Chronic cough: Secondary | ICD-10-CM | POA: Diagnosis not present

## 2020-03-20 DIAGNOSIS — M8588 Other specified disorders of bone density and structure, other site: Secondary | ICD-10-CM | POA: Diagnosis not present

## 2020-03-20 LAB — NOVEL CORONAVIRUS, NAA: SARS-CoV-2, NAA: NOT DETECTED

## 2020-03-20 LAB — SARS-COV-2, NAA 2 DAY TAT

## 2020-03-21 ENCOUNTER — Other Ambulatory Visit: Payer: Self-pay | Admitting: Internal Medicine

## 2020-03-21 ENCOUNTER — Other Ambulatory Visit (HOSPITAL_COMMUNITY): Payer: Self-pay | Admitting: Internal Medicine

## 2020-03-21 DIAGNOSIS — R053 Chronic cough: Secondary | ICD-10-CM

## 2020-04-05 ENCOUNTER — Ambulatory Visit: Payer: BLUE CROSS/BLUE SHIELD

## 2020-04-22 DIAGNOSIS — Z23 Encounter for immunization: Secondary | ICD-10-CM | POA: Diagnosis not present

## 2020-05-01 ENCOUNTER — Other Ambulatory Visit: Payer: Self-pay

## 2020-05-01 DIAGNOSIS — K5100 Ulcerative (chronic) pancolitis without complications: Secondary | ICD-10-CM

## 2020-05-01 NOTE — Progress Notes (Signed)
Pt presents today for laps requested by PCP, Dr.Sparks and GI, Michele Rockers, NP.  Make sure lab results are faxed to dr. Judithann Sheen.  CL,RMA

## 2020-05-04 LAB — CMP12+LP+TP+TSH+6AC+PSA+CBC…

## 2020-05-04 LAB — B12 AND FOLATE PANEL

## 2020-05-04 LAB — QUANTIFERON-TB GOLD PLUS
QuantiFERON Mitogen Value: >10 IU/mL
QuantiFERON Nil Value: 0 IU/mL
QuantiFERON TB1 Ag Value: 0 IU/mL
QuantiFERON TB2 Ag Value: 0.02 IU/mL
QuantiFERON-TB Gold Plus: NEGATIVE

## 2020-05-04 LAB — VITAMIN D 25 HYDROXY (VIT D DEFICIENCY, FRACTURES)

## 2020-05-04 LAB — ZINC

## 2020-05-04 LAB — SEDIMENTATION RATE

## 2020-05-04 LAB — HEPATITIS B SURFACE ANTIBODY,QUALITATIVE

## 2020-05-04 LAB — C-REACTIVE PROTEIN

## 2020-05-08 LAB — ZINC, WHOLE BLOOD

## 2020-05-08 LAB — SPECIMEN STATUS REPORT

## 2020-05-09 ENCOUNTER — Telehealth: Payer: Self-pay

## 2020-05-10 ENCOUNTER — Other Ambulatory Visit: Payer: Self-pay

## 2020-05-10 DIAGNOSIS — G4700 Insomnia, unspecified: Secondary | ICD-10-CM | POA: Insufficient documentation

## 2020-05-10 DIAGNOSIS — K5100 Ulcerative (chronic) pancolitis without complications: Secondary | ICD-10-CM | POA: Insufficient documentation

## 2020-05-10 DIAGNOSIS — I712 Thoracic aortic aneurysm, without rupture, unspecified: Secondary | ICD-10-CM | POA: Insufficient documentation

## 2020-05-10 LAB — CMP12+LP+TP+TSH+6AC+PSA+CBC…
Basophils Absolute: 0 10*3/uL (ref 0.0–0.2)
Basos: 1 %
EOS (ABSOLUTE): 0.2 10*3/uL (ref 0.0–0.4)
Eos: 3 %
Hematocrit: 48.4 % (ref 37.5–51.0)
Hemoglobin: 16.2 g/dL (ref 13.0–17.7)
Immature Grans (Abs): 0 10*3/uL (ref 0.0–0.1)
Immature Granulocytes: 0 %
Lymphocytes Absolute: 2.5 10*3/uL (ref 0.7–3.1)
Lymphs: 40 %
MCH: 31.1 pg (ref 26.6–33.0)
MCHC: 33.5 g/dL (ref 31.5–35.7)
MCV: 93 fL (ref 79–97)
Monocytes Absolute: 0.4 10*3/uL (ref 0.1–0.9)
Monocytes: 6 %
Neutrophils Absolute: 3 10*3/uL (ref 1.4–7.0)
Neutrophils: 50 %
Platelets: 285 10*3/uL (ref 150–450)
RBC: 5.21 x10E6/uL (ref 4.14–5.80)
RDW: 13.5 % (ref 11.6–15.4)
WBC: 6.1 10*3/uL (ref 3.4–10.8)

## 2020-05-10 LAB — VITAMIN D 25 HYDROXY (VIT D DEFICIENCY, FRACTURES)

## 2020-05-10 LAB — B12 AND FOLATE PANEL

## 2020-05-10 LAB — C-REACTIVE PROTEIN

## 2020-05-10 LAB — ZINC: Zinc: 118 ug/dL — ABNORMAL HIGH (ref 44–115)

## 2020-05-10 LAB — HEPATITIS B SURFACE ANTIBODY,QUALITATIVE: Hep B Surface Ab, Qual: NONREACTIVE

## 2020-05-10 LAB — SEDIMENTATION RATE

## 2020-05-10 NOTE — Addendum Note (Signed)
Addended by: Etheleen Sia on: 05/10/2020 08:45 AM   Modules accepted: Orders

## 2020-05-11 LAB — CMP12+LP+TP+TSH+6AC+PSA+CBC…
ALT: 18 IU/L (ref 0–44)
AST: 25 IU/L (ref 0–40)
Albumin/Globulin Ratio: 1.7 (ref 1.2–2.2)
Albumin: 4.5 g/dL (ref 3.8–4.8)
Alkaline Phosphatase: 54 IU/L (ref 44–121)
BUN/Creatinine Ratio: 18 (ref 10–24)
BUN: 17 mg/dL (ref 8–27)
Basophils Absolute: 0 10*3/uL (ref 0.0–0.2)
Basos: 0 %
Bilirubin Total: 1.2 mg/dL (ref 0.0–1.2)
Calcium: 9.5 mg/dL (ref 8.6–10.2)
Chloride: 102 mmol/L (ref 96–106)
Chol/HDL Ratio: 4.2 ratio (ref 0.0–5.0)
Cholesterol, Total: 202 mg/dL — ABNORMAL HIGH (ref 100–199)
Creatinine, Ser: 0.92 mg/dL (ref 0.76–1.27)
EOS (ABSOLUTE): 0.2 10*3/uL (ref 0.0–0.4)
Eos: 3 %
Estimated CHD Risk: 0.8 times avg. (ref 0.0–1.0)
Free Thyroxine Index: 2.1 (ref 1.2–4.9)
GFR calc Af Amer: 98 mL/min/{1.73_m2} (ref >59)
GFR calc non Af Amer: 85 mL/min/{1.73_m2} (ref >59)
GGT: 16 IU/L (ref 0–65)
Globulin, Total: 2.7 g/dL (ref 1.5–4.5)
Glucose: 92 mg/dL (ref 65–99)
HDL: 48 mg/dL (ref >39)
Hematocrit: 47 % (ref 37.5–51.0)
Hemoglobin: 15.9 g/dL (ref 13.0–17.7)
Immature Grans (Abs): 0 10*3/uL (ref 0.0–0.1)
Immature Granulocytes: 0 %
Iron: 107 ug/dL (ref 38–169)
LDH: 126 IU/L (ref 121–224)
LDL Chol Calc (NIH): 123 mg/dL — ABNORMAL HIGH (ref 0–99)
Lymphocytes Absolute: 2.3 10*3/uL (ref 0.7–3.1)
Lymphs: 35 %
MCH: 31.1 pg (ref 26.6–33.0)
MCHC: 33.8 g/dL (ref 31.5–35.7)
MCV: 92 fL (ref 79–97)
Monocytes Absolute: 0.4 10*3/uL (ref 0.1–0.9)
Monocytes: 7 %
Neutrophils Absolute: 3.5 10*3/uL (ref 1.4–7.0)
Neutrophils: 55 %
Phosphorus: 3.2 mg/dL (ref 2.8–4.1)
Platelets: 252 10*3/uL (ref 150–450)
Potassium: 4.3 mmol/L (ref 3.5–5.2)
Prostate Specific Ag, Serum: 0.6 ng/mL (ref 0.0–4.0)
RBC: 5.11 x10E6/uL (ref 4.14–5.80)
RDW: 13.2 % (ref 11.6–15.4)
Sodium: 142 mmol/L (ref 134–144)
T3 Uptake Ratio: 27 % (ref 24–39)
T4, Total: 7.6 ug/dL (ref 4.5–12.0)
TSH: 1.23 u[IU]/mL (ref 0.450–4.500)
Total Protein: 7.2 g/dL (ref 6.0–8.5)
Triglycerides: 174 mg/dL — ABNORMAL HIGH (ref 0–149)
Uric Acid: 5.7 mg/dL (ref 3.8–8.4)
VLDL Cholesterol Cal: 31 mg/dL (ref 5–40)
WBC: 6.4 10*3/uL (ref 3.4–10.8)

## 2020-05-11 LAB — C-REACTIVE PROTEIN: CRP: 2 mg/L (ref 0–10)

## 2020-05-11 LAB — SEDIMENTATION RATE: Sed Rate: 11 mm/hr (ref 0–30)

## 2020-05-11 LAB — B12 AND FOLATE PANEL
Folate: >20 ng/mL (ref >3.0)
Vitamin B-12: 387 pg/mL (ref 232–1245)

## 2020-05-11 LAB — HEPATITIS B SURFACE ANTIBODY,QUALITATIVE: Hep B Surface Ab, Qual: NONREACTIVE

## 2020-05-11 LAB — VITAMIN D 25 HYDROXY (VIT D DEFICIENCY, FRACTURES): Vit D, 25-Hydroxy: 20.8 ng/mL — ABNORMAL LOW (ref 30.0–100.0)

## 2020-05-14 DIAGNOSIS — Z0000 Encounter for general adult medical examination without abnormal findings: Secondary | ICD-10-CM | POA: Diagnosis not present

## 2020-05-14 DIAGNOSIS — K5100 Ulcerative (chronic) pancolitis without complications: Secondary | ICD-10-CM | POA: Diagnosis not present

## 2020-05-14 DIAGNOSIS — E782 Mixed hyperlipidemia: Secondary | ICD-10-CM | POA: Diagnosis not present

## 2020-05-14 DIAGNOSIS — I10 Essential (primary) hypertension: Secondary | ICD-10-CM | POA: Diagnosis not present

## 2020-05-16 DIAGNOSIS — D225 Melanocytic nevi of trunk: Secondary | ICD-10-CM | POA: Diagnosis not present

## 2020-05-16 DIAGNOSIS — D2261 Melanocytic nevi of right upper limb, including shoulder: Secondary | ICD-10-CM | POA: Diagnosis not present

## 2020-05-16 DIAGNOSIS — D485 Neoplasm of uncertain behavior of skin: Secondary | ICD-10-CM | POA: Diagnosis not present

## 2020-05-16 DIAGNOSIS — D2262 Melanocytic nevi of left upper limb, including shoulder: Secondary | ICD-10-CM | POA: Diagnosis not present

## 2020-05-16 DIAGNOSIS — X32XXXA Exposure to sunlight, initial encounter: Secondary | ICD-10-CM | POA: Diagnosis not present

## 2020-05-16 DIAGNOSIS — D2271 Melanocytic nevi of right lower limb, including hip: Secondary | ICD-10-CM | POA: Diagnosis not present

## 2020-05-16 DIAGNOSIS — Z85828 Personal history of other malignant neoplasm of skin: Secondary | ICD-10-CM | POA: Diagnosis not present

## 2020-05-16 DIAGNOSIS — D044 Carcinoma in situ of skin of scalp and neck: Secondary | ICD-10-CM | POA: Diagnosis not present

## 2020-05-16 DIAGNOSIS — L570 Actinic keratosis: Secondary | ICD-10-CM | POA: Diagnosis not present

## 2020-05-28 DIAGNOSIS — S39013A Strain of muscle, fascia and tendon of pelvis, initial encounter: Secondary | ICD-10-CM | POA: Diagnosis not present

## 2020-06-06 DIAGNOSIS — D044 Carcinoma in situ of skin of scalp and neck: Secondary | ICD-10-CM | POA: Diagnosis not present

## 2020-06-19 DIAGNOSIS — K5100 Ulcerative (chronic) pancolitis without complications: Secondary | ICD-10-CM | POA: Diagnosis not present

## 2020-07-15 ENCOUNTER — Other Ambulatory Visit: Payer: Self-pay

## 2020-08-16 DIAGNOSIS — I10 Essential (primary) hypertension: Secondary | ICD-10-CM | POA: Diagnosis not present

## 2020-08-16 DIAGNOSIS — K5100 Ulcerative (chronic) pancolitis without complications: Secondary | ICD-10-CM | POA: Diagnosis not present

## 2020-08-16 DIAGNOSIS — E7800 Pure hypercholesterolemia, unspecified: Secondary | ICD-10-CM | POA: Diagnosis not present

## 2020-08-16 DIAGNOSIS — Z79899 Other long term (current) drug therapy: Secondary | ICD-10-CM | POA: Diagnosis not present

## 2020-09-23 ENCOUNTER — Telehealth: Payer: Self-pay | Admitting: Podiatry

## 2020-09-23 NOTE — Telephone Encounter (Signed)
Pt left message on 7.7 when I was out of the office stating he needed an appt to get new orthotics. Last pair was he believes 2011.  I returned call and apologized but asked pt to call to schedule an appt in [place] office with a provider and they should be able to cast pt at that time.

## 2020-09-25 ENCOUNTER — Ambulatory Visit (INDEPENDENT_AMBULATORY_CARE_PROVIDER_SITE_OTHER): Payer: 59 | Admitting: Podiatry

## 2020-09-25 ENCOUNTER — Other Ambulatory Visit: Payer: 59

## 2020-09-25 ENCOUNTER — Encounter: Payer: Self-pay | Admitting: Podiatry

## 2020-09-25 ENCOUNTER — Other Ambulatory Visit: Payer: Self-pay

## 2020-09-25 DIAGNOSIS — M722 Plantar fascial fibromatosis: Secondary | ICD-10-CM | POA: Diagnosis not present

## 2020-09-25 NOTE — Progress Notes (Signed)
  Subjective:  Patient ID: Scott Bautista, male    DOB: September 14, 1950,  MRN: [id number]  Chief Complaint  Patient presents with   Foot Orthotics    (np) patient is interested in orthotics    70 y.o. male presents with the above complaint. History confirmed with patient.  Previously had history of plantar fasciitis and had orthotics made by Dr. Al Corpus in 2011.  These have been very helpful.  Started notice that they are less comfortable and they were not feel if they are starting to wear out and breakdown.  Would like some new ones made  Objective:  Physical Exam: warm, good capillary refill, no trophic changes or ulcerative lesions, normal DP and PT pulses, and normal sensory exam. Left Foot: normal exam, no swelling, tenderness, instability; ligaments intact, full range of motion of all ankle/foot joints Right Foot: normal exam, no swelling, tenderness, instability; ligaments intact, full range of motion of all ankle/foot joints  Assessment:   1. Plantar fasciitis of right foot   2. Plantar fasciitis of left foot      Plan:  Patient was evaluated and treated and all questions answered.  Casted for new custom molded orthoses today.  I took photos of his old orthoses.  We will try to get these to match as closely as possible.  We will send the photos with the orthotics order.  We will have a deep heel cup of the arch and Spenco top layer with PPT forefoot.  We will call when his orthotics are ready.  Likely will send his old orthotics for refurbishment once his new orthotics are comfortable.  Return for orthotics Pick-up when ready .

## 2020-10-14 ENCOUNTER — Telehealth: Payer: Self-pay | Admitting: Podiatry

## 2020-10-14 NOTE — Telephone Encounter (Signed)
Orthotics in... pt aware ok to pick up in [place] office on Wednesday 8.3.2022.

## 2020-10-16 DIAGNOSIS — M722 Plantar fascial fibromatosis: Secondary | ICD-10-CM

## 2020-11-21 ENCOUNTER — Other Ambulatory Visit: Payer: Self-pay

## 2020-11-21 DIAGNOSIS — R7989 Other specified abnormal findings of blood chemistry: Secondary | ICD-10-CM

## 2020-11-21 DIAGNOSIS — Z79899 Other long term (current) drug therapy: Secondary | ICD-10-CM

## 2020-11-21 DIAGNOSIS — E7800 Pure hypercholesterolemia, unspecified: Secondary | ICD-10-CM

## 2020-11-21 DIAGNOSIS — Z131 Encounter for screening for diabetes mellitus: Secondary | ICD-10-CM

## 2020-11-21 DIAGNOSIS — E559 Vitamin D deficiency, unspecified: Secondary | ICD-10-CM

## 2020-11-21 DIAGNOSIS — I10 Essential (primary) hypertension: Secondary | ICD-10-CM

## 2020-11-21 DIAGNOSIS — Z125 Encounter for screening for malignant neoplasm of prostate: Secondary | ICD-10-CM

## 2020-11-26 LAB — CMP12+LP+TP+TSH+6AC+PSA+CBC…
ALT: 20 IU/L (ref 0–44)
AST: 27 IU/L (ref 0–40)
Albumin/Globulin Ratio: 2.1 (ref 1.2–2.2)
Albumin: 4.6 g/dL (ref 3.8–4.8)
Alkaline Phosphatase: 60 IU/L (ref 44–121)
BUN/Creatinine Ratio: 21 (ref 10–24)
BUN: 18 mg/dL (ref 8–27)
Basophils Absolute: 0 10*3/uL (ref 0.0–0.2)
Basos: 0 %
Bilirubin Total: 0.9 mg/dL (ref 0.0–1.2)
Calcium: 9.3 mg/dL (ref 8.6–10.2)
Chloride: 100 mmol/L (ref 96–106)
Chol/HDL Ratio: 4.4 ratio (ref 0.0–5.0)
Cholesterol, Total: 208 mg/dL — ABNORMAL HIGH (ref 100–199)
Creatinine, Ser: 0.87 mg/dL (ref 0.76–1.27)
EOS (ABSOLUTE): 0.2 10*3/uL (ref 0.0–0.4)
Eos: 2 %
Estimated CHD Risk: 0.9 times avg. (ref 0.0–1.0)
Free Thyroxine Index: 2.2 (ref 1.2–4.9)
GGT: 19 IU/L (ref 0–65)
Globulin, Total: 2.2 g/dL (ref 1.5–4.5)
Glucose: 89 mg/dL (ref 65–99)
HDL: 47 mg/dL (ref >39)
Hematocrit: 46.4 % (ref 37.5–51.0)
Hemoglobin: 15.9 g/dL (ref 13.0–17.7)
Immature Grans (Abs): 0 10*3/uL (ref 0.0–0.1)
Immature Granulocytes: 0 %
Iron: 69 ug/dL (ref 38–169)
LDH: 147 IU/L (ref 121–224)
LDL Chol Calc (NIH): 134 mg/dL — ABNORMAL HIGH (ref 0–99)
Lymphocytes Absolute: 2.9 10*3/uL (ref 0.7–3.1)
Lymphs: 42 %
MCH: 31.1 pg (ref 26.6–33.0)
MCHC: 34.3 g/dL (ref 31.5–35.7)
MCV: 91 fL (ref 79–97)
Monocytes Absolute: 0.4 10*3/uL (ref 0.1–0.9)
Monocytes: 6 %
Neutrophils Absolute: 3.5 10*3/uL (ref 1.4–7.0)
Neutrophils: 50 %
Phosphorus: 3.6 mg/dL (ref 2.8–4.1)
Platelets: 248 10*3/uL (ref 150–450)
Potassium: 4.2 mmol/L (ref 3.5–5.2)
Prostate Specific Ag, Serum: 0.6 ng/mL (ref 0.0–4.0)
RBC: 5.12 x10E6/uL (ref 4.14–5.80)
RDW: 13.1 % (ref 11.6–15.4)
Sodium: 138 mmol/L (ref 134–144)
T3 Uptake Ratio: 25 % (ref 24–39)
T4, Total: 8.6 ug/dL (ref 4.5–12.0)
TSH: 1.59 u[IU]/mL (ref 0.450–4.500)
Total Protein: 6.8 g/dL (ref 6.0–8.5)
Triglycerides: 152 mg/dL — ABNORMAL HIGH (ref 0–149)
Uric Acid: 6.5 mg/dL (ref 3.8–8.4)
VLDL Cholesterol Cal: 27 mg/dL (ref 5–40)
WBC: 7 10*3/uL (ref 3.4–10.8)
eGFR: 93 mL/min/{1.73_m2} (ref >59)

## 2020-11-26 LAB — URINALYSIS, ROUTINE W REFLEX MICROSCOPIC
Bilirubin, UA: NEGATIVE
Glucose, UA: NEGATIVE
Ketones, UA: NEGATIVE
Leukocytes,UA: NEGATIVE
Nitrite, UA: NEGATIVE
Protein,UA: NEGATIVE
RBC, UA: NEGATIVE
Specific Gravity, UA: 1.01 (ref 1.005–1.030)
Urobilinogen, Ur: 0.2 mg/dL (ref 0.2–1.0)
pH, UA: 6 (ref 5.0–7.5)

## 2020-11-26 LAB — VITAMIN D 25 HYDROXY (VIT D DEFICIENCY, FRACTURES): Vit D, 25-Hydroxy: 32 ng/mL (ref 30.0–100.0)

## 2020-11-26 LAB — TESTOSTERONE,FREE AND TOTAL
Testosterone, Free: 5.3 pg/mL — ABNORMAL LOW (ref 6.6–18.1)
Testosterone: 307 ng/dL (ref 264–916)

## 2020-11-26 LAB — HGB A1C W/O EAG: Hgb A1c MFr Bld: 5.7 % — ABNORMAL HIGH (ref 4.8–5.6)

## 2021-01-10 ENCOUNTER — Other Ambulatory Visit: Payer: Self-pay

## 2021-01-10 DIAGNOSIS — J029 Acute pharyngitis, unspecified: Secondary | ICD-10-CM

## 2021-01-10 DIAGNOSIS — Z20822 Contact with and (suspected) exposure to covid-19: Secondary | ICD-10-CM

## 2021-01-10 DIAGNOSIS — R059 Cough, unspecified: Secondary | ICD-10-CM

## 2021-01-10 DIAGNOSIS — R0981 Nasal congestion: Secondary | ICD-10-CM

## 2021-01-10 LAB — POC COVID19 BINAXNOW: SARS Coronavirus 2 Ag: NEGATIVE

## 2021-01-10 NOTE — Progress Notes (Signed)
S/Sx since Tuesday (01/07/2021): Cough - dry Nasal Congestion Sore throat - sinus drainage Mild sinus pain/pressure  Denies: Fever N/V/D No ear discomfort No teeth discomfort  Vaccinated x2 - No booster  Rapid Covid Test = Negative PCR Covid Test = Pending ([facility] for processing)  Has Mychart & can see results over the weekend  AMD

## 2021-01-11 LAB — SARS-COV-2, NAA 2 DAY TAT

## 2021-01-11 LAB — NOVEL CORONAVIRUS, NAA: SARS-CoV-2, NAA: NOT DETECTED

## 2021-03-11 ENCOUNTER — Other Ambulatory Visit: Payer: Self-pay | Admitting: Internal Medicine

## 2021-03-11 DIAGNOSIS — I712 Thoracic aortic aneurysm, without rupture, unspecified: Secondary | ICD-10-CM

## 2021-06-03 DIAGNOSIS — E7800 Pure hypercholesterolemia, unspecified: Secondary | ICD-10-CM | POA: Diagnosis not present

## 2021-06-03 DIAGNOSIS — Z79899 Other long term (current) drug therapy: Secondary | ICD-10-CM | POA: Diagnosis not present

## 2021-06-03 DIAGNOSIS — I10 Essential (primary) hypertension: Secondary | ICD-10-CM | POA: Diagnosis not present

## 2021-06-03 DIAGNOSIS — R7309 Other abnormal glucose: Secondary | ICD-10-CM | POA: Diagnosis not present

## 2021-06-03 DIAGNOSIS — E291 Testicular hypofunction: Secondary | ICD-10-CM | POA: Diagnosis not present

## 2021-06-05 ENCOUNTER — Ambulatory Visit
Admission: RE | Admit: 2021-06-05 | Discharge: 2021-06-05 | Disposition: A | Discharge status: Home / Self Care | Payer: No Typology Code available for payment source | Source: Ambulatory Visit | Attending: Internal Medicine | Admitting: Internal Medicine

## 2021-06-05 ENCOUNTER — Other Ambulatory Visit: Payer: Self-pay

## 2021-06-05 ENCOUNTER — Other Ambulatory Visit: Payer: Self-pay | Admitting: Internal Medicine

## 2021-06-05 ENCOUNTER — Other Ambulatory Visit: Payer: BLUE CROSS/BLUE SHIELD

## 2021-06-05 DIAGNOSIS — I712 Thoracic aortic aneurysm, without rupture, unspecified: Secondary | ICD-10-CM | POA: Diagnosis not present

## 2021-06-05 DIAGNOSIS — K5730 Diverticulosis of large intestine without perforation or abscess without bleeding: Secondary | ICD-10-CM | POA: Diagnosis not present

## 2021-06-05 DIAGNOSIS — I700 Atherosclerosis of aorta: Secondary | ICD-10-CM | POA: Diagnosis not present

## 2021-06-05 DIAGNOSIS — N281 Cyst of kidney, acquired: Secondary | ICD-10-CM | POA: Diagnosis not present

## 2021-06-05 DIAGNOSIS — N433 Hydrocele, unspecified: Secondary | ICD-10-CM | POA: Diagnosis not present

## 2021-06-05 DIAGNOSIS — I7121 Aneurysm of the ascending aorta, without rupture: Secondary | ICD-10-CM | POA: Diagnosis not present

## 2021-06-05 DIAGNOSIS — N200 Calculus of kidney: Secondary | ICD-10-CM | POA: Diagnosis not present

## 2021-06-05 MED ORDER — IOHEXOL 350 MG/ML SOLN
100.0000 mL | Freq: Once | INTRAVENOUS | Status: AC | PRN
  Administered 2021-06-05: 100 mL via INTRAVENOUS

## 2021-06-10 DIAGNOSIS — I10 Essential (primary) hypertension: Secondary | ICD-10-CM | POA: Diagnosis not present

## 2021-06-10 DIAGNOSIS — M5137 Other intervertebral disc degeneration, lumbosacral region: Secondary | ICD-10-CM | POA: Diagnosis not present

## 2021-06-10 DIAGNOSIS — K5100 Ulcerative (chronic) pancolitis without complications: Secondary | ICD-10-CM | POA: Diagnosis not present

## 2021-06-10 DIAGNOSIS — T466X5A Adverse effect of antihyperlipidemic and antiarteriosclerotic drugs, initial encounter: Secondary | ICD-10-CM | POA: Diagnosis not present

## 2021-06-10 DIAGNOSIS — Z125 Encounter for screening for malignant neoplasm of prostate: Secondary | ICD-10-CM | POA: Diagnosis not present

## 2021-06-10 DIAGNOSIS — E782 Mixed hyperlipidemia: Secondary | ICD-10-CM | POA: Diagnosis not present

## 2021-06-10 DIAGNOSIS — G720 Drug-induced myopathy: Secondary | ICD-10-CM | POA: Diagnosis not present

## 2021-06-10 DIAGNOSIS — Z79899 Other long term (current) drug therapy: Secondary | ICD-10-CM | POA: Diagnosis not present

## 2021-06-10 DIAGNOSIS — R7309 Other abnormal glucose: Secondary | ICD-10-CM | POA: Diagnosis not present

## 2021-12-04 DIAGNOSIS — E782 Mixed hyperlipidemia: Secondary | ICD-10-CM | POA: Diagnosis not present

## 2021-12-04 DIAGNOSIS — Z79899 Other long term (current) drug therapy: Secondary | ICD-10-CM | POA: Diagnosis not present

## 2021-12-04 DIAGNOSIS — Z125 Encounter for screening for malignant neoplasm of prostate: Secondary | ICD-10-CM | POA: Diagnosis not present

## 2021-12-04 DIAGNOSIS — I10 Essential (primary) hypertension: Secondary | ICD-10-CM | POA: Diagnosis not present

## 2021-12-04 DIAGNOSIS — R7309 Other abnormal glucose: Secondary | ICD-10-CM | POA: Diagnosis not present

## 2021-12-11 DIAGNOSIS — K5100 Ulcerative (chronic) pancolitis without complications: Secondary | ICD-10-CM | POA: Diagnosis not present

## 2021-12-11 DIAGNOSIS — Z79899 Other long term (current) drug therapy: Secondary | ICD-10-CM | POA: Diagnosis not present

## 2021-12-11 DIAGNOSIS — R7309 Other abnormal glucose: Secondary | ICD-10-CM | POA: Diagnosis not present

## 2021-12-11 DIAGNOSIS — T466X5A Adverse effect of antihyperlipidemic and antiarteriosclerotic drugs, initial encounter: Secondary | ICD-10-CM | POA: Diagnosis not present

## 2021-12-11 DIAGNOSIS — G720 Drug-induced myopathy: Secondary | ICD-10-CM | POA: Diagnosis not present

## 2021-12-11 DIAGNOSIS — M5137 Other intervertebral disc degeneration, lumbosacral region: Secondary | ICD-10-CM | POA: Diagnosis not present

## 2021-12-11 DIAGNOSIS — Z0000 Encounter for general adult medical examination without abnormal findings: Secondary | ICD-10-CM | POA: Diagnosis not present

## 2021-12-11 DIAGNOSIS — I10 Essential (primary) hypertension: Secondary | ICD-10-CM | POA: Diagnosis not present

## 2021-12-11 DIAGNOSIS — E782 Mixed hyperlipidemia: Secondary | ICD-10-CM | POA: Diagnosis not present

## 2021-12-11 DIAGNOSIS — Z23 Encounter for immunization: Secondary | ICD-10-CM | POA: Diagnosis not present

## 2022-01-12 ENCOUNTER — Encounter (INDEPENDENT_AMBULATORY_CARE_PROVIDER_SITE_OTHER): Payer: Self-pay

## 2022-04-15 DIAGNOSIS — H35033 Hypertensive retinopathy, bilateral: Secondary | ICD-10-CM | POA: Diagnosis not present

## 2022-04-15 DIAGNOSIS — H524 Presbyopia: Secondary | ICD-10-CM | POA: Diagnosis not present

## 2022-04-15 DIAGNOSIS — Z0100 Encounter for examination of eyes and vision without abnormal findings: Secondary | ICD-10-CM | POA: Diagnosis not present

## 2022-05-18 DIAGNOSIS — H40023 Open angle with borderline findings, high risk, bilateral: Secondary | ICD-10-CM | POA: Diagnosis not present

## 2022-06-04 DIAGNOSIS — I10 Essential (primary) hypertension: Secondary | ICD-10-CM | POA: Diagnosis not present

## 2022-06-04 DIAGNOSIS — Z79899 Other long term (current) drug therapy: Secondary | ICD-10-CM | POA: Diagnosis not present

## 2022-06-04 DIAGNOSIS — E782 Mixed hyperlipidemia: Secondary | ICD-10-CM | POA: Diagnosis not present

## 2022-06-04 DIAGNOSIS — R7309 Other abnormal glucose: Secondary | ICD-10-CM | POA: Diagnosis not present

## 2022-06-11 DIAGNOSIS — Z125 Encounter for screening for malignant neoplasm of prostate: Secondary | ICD-10-CM | POA: Diagnosis not present

## 2022-06-11 DIAGNOSIS — I10 Essential (primary) hypertension: Secondary | ICD-10-CM | POA: Diagnosis not present

## 2022-06-11 DIAGNOSIS — Z0000 Encounter for general adult medical examination without abnormal findings: Secondary | ICD-10-CM | POA: Diagnosis not present

## 2022-06-11 DIAGNOSIS — R7303 Prediabetes: Secondary | ICD-10-CM | POA: Diagnosis not present

## 2022-06-11 DIAGNOSIS — T466X5A Adverse effect of antihyperlipidemic and antiarteriosclerotic drugs, initial encounter: Secondary | ICD-10-CM | POA: Diagnosis not present

## 2022-06-11 DIAGNOSIS — K5100 Ulcerative (chronic) pancolitis without complications: Secondary | ICD-10-CM | POA: Diagnosis not present

## 2022-06-11 DIAGNOSIS — E782 Mixed hyperlipidemia: Secondary | ICD-10-CM | POA: Diagnosis not present

## 2022-06-11 DIAGNOSIS — G720 Drug-induced myopathy: Secondary | ICD-10-CM | POA: Diagnosis not present

## 2022-06-11 DIAGNOSIS — Z79899 Other long term (current) drug therapy: Secondary | ICD-10-CM | POA: Diagnosis not present

## 2022-06-11 DIAGNOSIS — I712 Thoracic aortic aneurysm, without rupture, unspecified: Secondary | ICD-10-CM | POA: Diagnosis not present

## 2022-07-09 IMAGING — CT CT ANGIO CHEST-ABD-PELV FOR DISSECTION W/ AND WO/W CM
2 of 6 series · 13 of 46 positions shown, 15 images · IV contrast (APPLIED)
Comparison: CT angiography chest 05/25/2018

CLINICAL DATA: Aortic aneurysm

EXAM:
CT ANGIOGRAPHY CHEST, ABDOMEN AND PELVIS
TECHNIQUE: Multidetector CT imaging through the chest, abdomen and pelvis was
performed using the standard protocol during bolus administration of
intravenous contrast. Multiplanar reconstructed images and MIPs were
obtained and reviewed to evaluate the vascular anatomy.

[Series 4: axial arterial · axial · arterial · 0.88mm/px · z∈[-661,-28]mm · 10 of 241 slices shown, 12 images]
[im 15/241  soft-tissue]
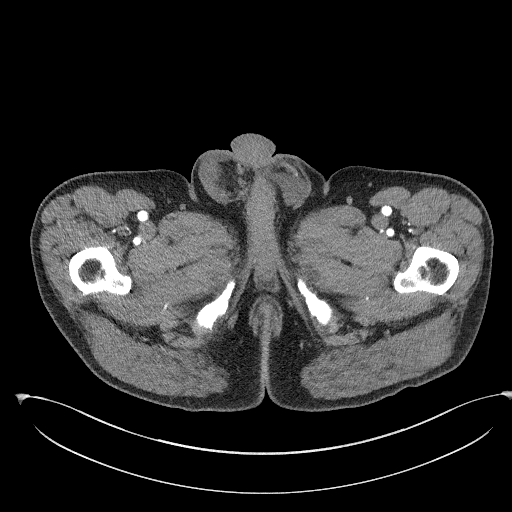
[im 15/241  bone]
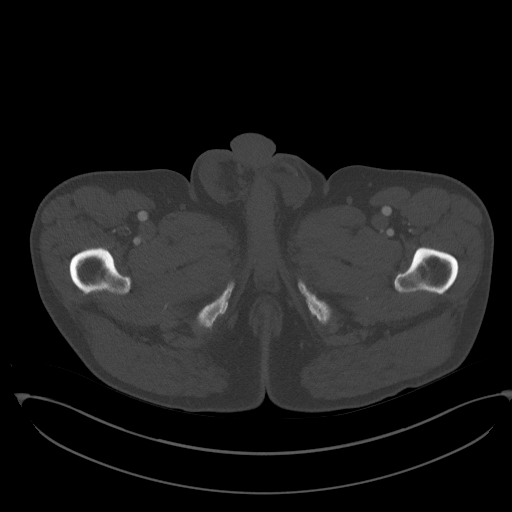
[im 43/241  soft-tissue]
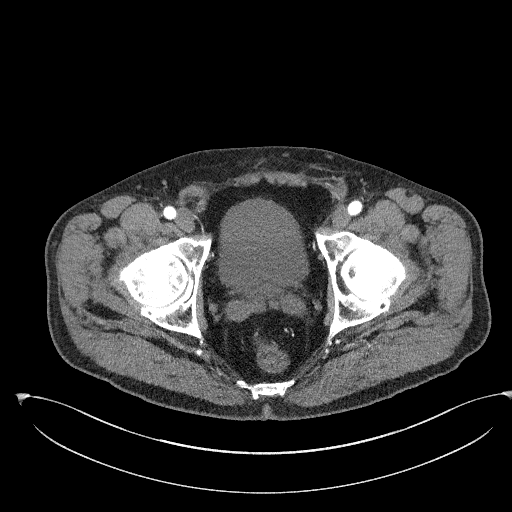
[im 71/241  soft-tissue]
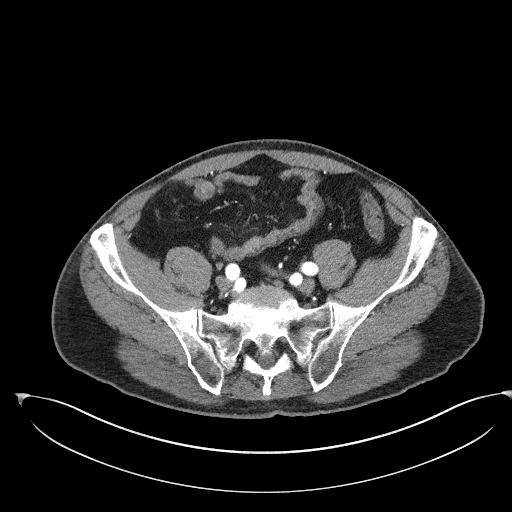
[im 85/241  soft-tissue]
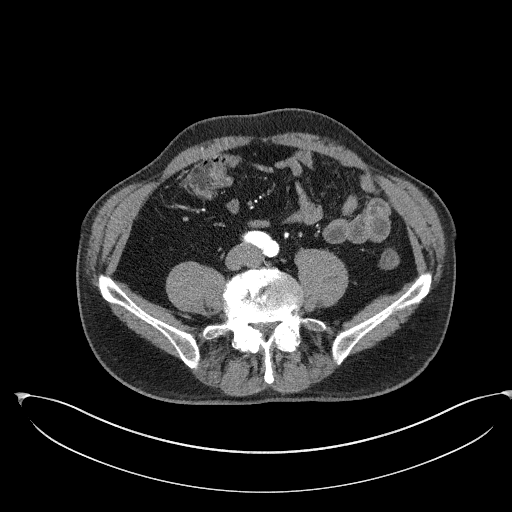
[im 113/241  soft-tissue]
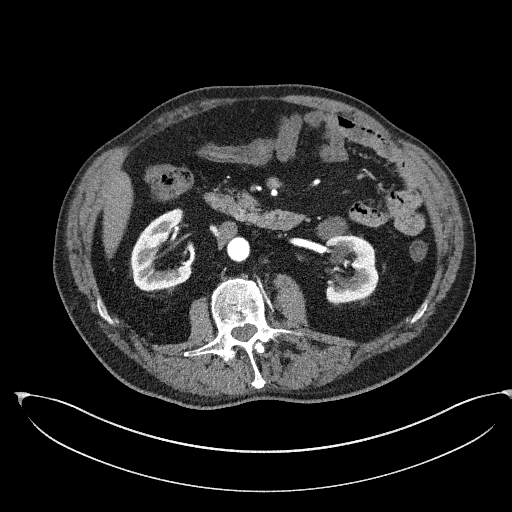
[im 128/241  soft-tissue]
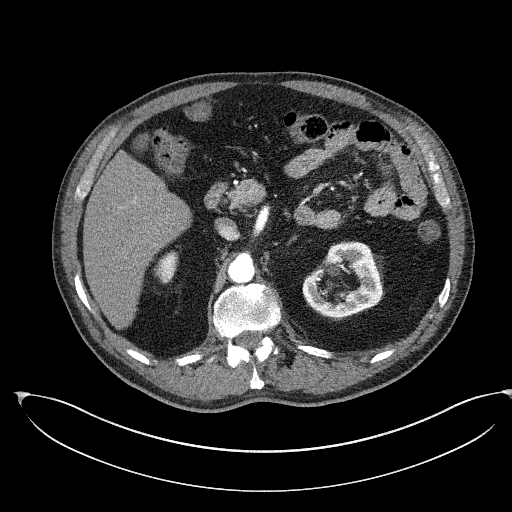
[im 156/241  soft-tissue]
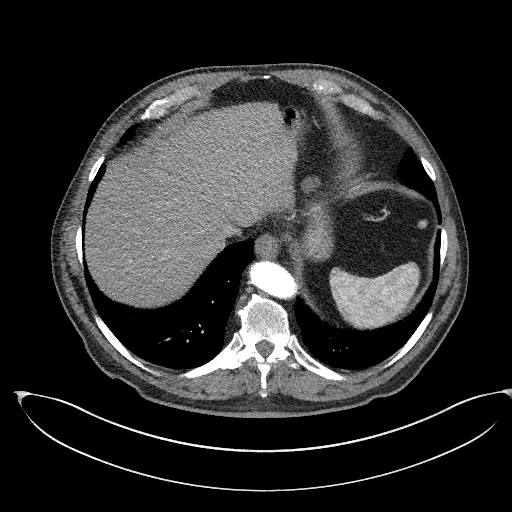
[im 184/241  soft-tissue]
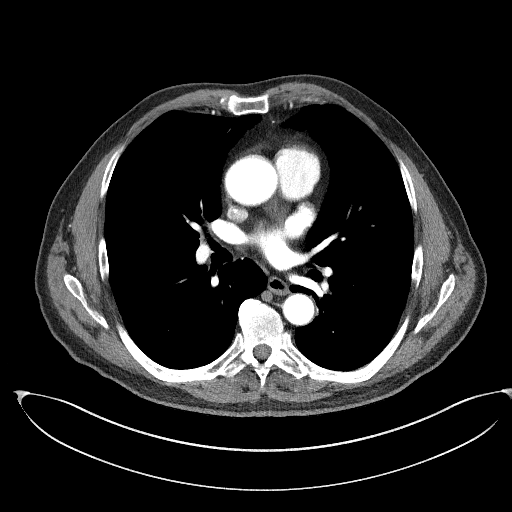
[im 198/241  soft-tissue]
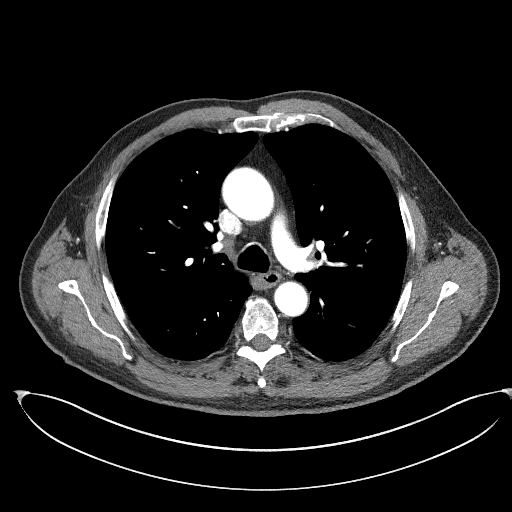
[im 198/241  bone]
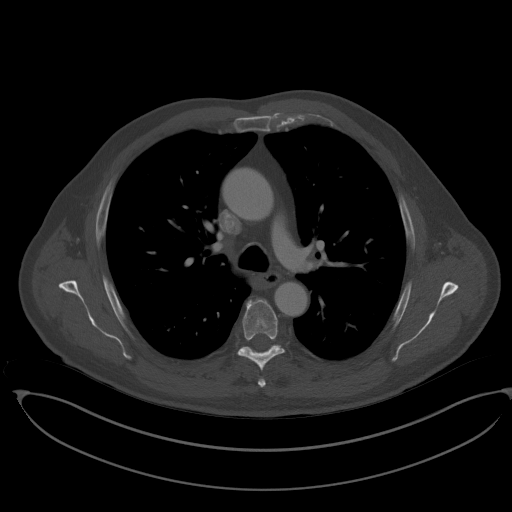
[im 226/241  soft-tissue]
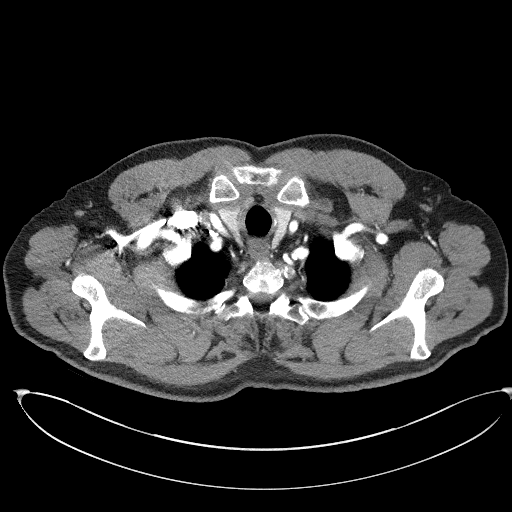

[Series 6: coronals · coronal · 0.92mm/px · 3 of 157 slices shown]
[im 40/157  soft-tissue]
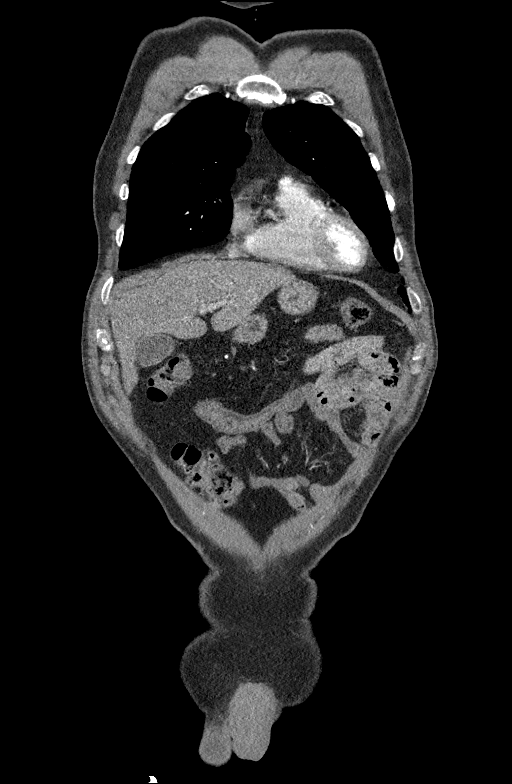
[im 79/157  soft-tissue]
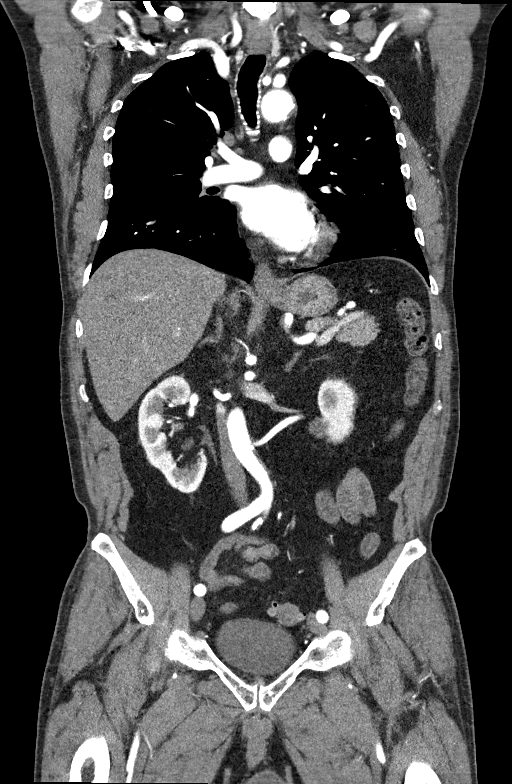
[im 118/157  soft-tissue]
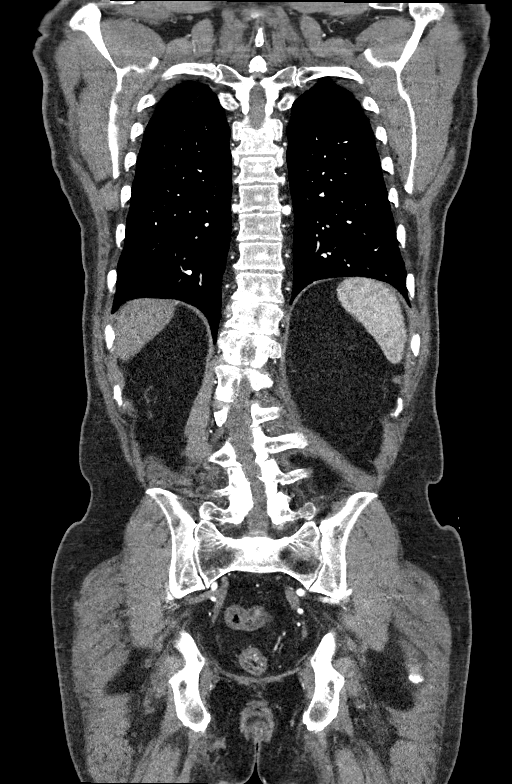

[13 of 46 positions shown; findings below may reference images not displayed]

RADIATION DOSE REDUCTION: This exam was performed according to the
departmental dose-optimization program which includes automated
exposure control, adjustment of the mA and/or kV according to
patient size and/or use of iterative reconstruction technique.

CONTRAST:  100mL OMNIPAQUE IOHEXOL 350 MG/ML SOLN
FINDINGS: CTA CHEST FINDINGS

Cardiovascular: Heart size is within normal limits. Ascending aortic
aneurysm measuring up to 4.3 cm is unchanged from prior exam.

Mediastinum/Nodes: No enlarged mediastinal, hilar, or axillary lymph
nodes. Thyroid gland, trachea, and esophagus demonstrate no
significant findings.

Lungs/Pleura: 5 mm right lower lobe pulmonary nodule (image 78,
series 5) is unchanged since prior examination which indicates a
benign etiology. 3 mm subpleural nodule in the left posterior lower
lobe (122/5 is unchanged since prior examination which also
indicates a benign etiology. Lungs otherwise clear.

Musculoskeletal: No chest wall abnormality. No acute or significant
osseous findings.

Review of the MIP images confirms the above findings.

CTA ABDOMEN AND PELVIS FINDINGS

VASCULAR

Aorta: Mild atherosclerotic changes of the infrarenal abdominal
aorta without significant stenosis or aneurysm.

Celiac: Patent without evidence of aneurysm, dissection, vasculitis
or significant stenosis.

SMA: Patent without evidence of aneurysm, dissection, vasculitis or
significant stenosis.

Renals: Both renal arteries are patent without evidence of aneurysm,
dissection, vasculitis, fibromuscular dysplasia or significant
stenosis.

IMA: Patent without evidence of aneurysm, dissection, vasculitis or
significant stenosis.

Inflow: Patent without evidence of aneurysm, dissection, vasculitis
or significant stenosis.

Veins: No obvious venous abnormality within the limitations of this
arterial phase study.

Review of the MIP images confirms the above findings.

NON-VASCULAR

Hepatobiliary: Minimal cholelithiasis. Liver, gallbladder, and bile
ducts otherwise unremarkable.

Pancreas: Unremarkable. No pancreatic ductal dilatation or
surrounding inflammatory changes.

Spleen: Normal in size without focal abnormality.

Adrenals/Urinary Tract: Adrenal glands are normal. 3 mm
nonobstructing calculus seen in upper pole of the right kidney. 6 mm
fat density lesion in the lower pole the right kidney likely an
angiomyolipoma. Multiple left simple renal cysts are present.
Low-density lobulated structures in the left renal hilum are likely
renal sinus cysts.

Stomach/Bowel: Descending and sigmoid colon diverticulosis without
evidence of acute diverticulitis. Appendix is normal. No bowel
dilatation to indicate ileus or obstruction.

Lymphatic: No enlarged abdominal or pelvic lymph nodes.

Reproductive: Prostate is mildly enlarged.

Other: Moderate size right and small left inguinal hernias.
Hydroceles noted bilaterally, larger on the left.

Musculoskeletal: Mild degenerative changes of the hips. Dextroconvex
rotary scoliosis of the lumbar spine. Degenerative changes seen
throughout the lumbar spine. No acute osseous abnormality.

Review of the MIP images confirms the above findings.
IMPRESSION: No significant interval change in size ascending aortic aneurysm
measuring up to 4.3 cm. Recommend annual imaging followup by CTA or
MRA. This recommendation follows 0101
ACCF/AHA/AATS/ACR/ASA/SCA/BANDALAN/FELNER/FERMAINT/KADRA Guidelines for the
Diagnosis and Management of Patients with Thoracic Aortic Disease.
Circulation. 0101; 121: [id number]. Aortic aneurysm NOS ([id number].O)

## 2022-12-09 DIAGNOSIS — Z125 Encounter for screening for malignant neoplasm of prostate: Secondary | ICD-10-CM | POA: Diagnosis not present

## 2022-12-09 DIAGNOSIS — E782 Mixed hyperlipidemia: Secondary | ICD-10-CM | POA: Diagnosis not present

## 2022-12-09 DIAGNOSIS — R7303 Prediabetes: Secondary | ICD-10-CM | POA: Diagnosis not present

## 2022-12-09 DIAGNOSIS — Z79899 Other long term (current) drug therapy: Secondary | ICD-10-CM | POA: Diagnosis not present

## 2022-12-09 DIAGNOSIS — I10 Essential (primary) hypertension: Secondary | ICD-10-CM | POA: Diagnosis not present

## 2022-12-16 DIAGNOSIS — R7303 Prediabetes: Secondary | ICD-10-CM | POA: Diagnosis not present

## 2022-12-16 DIAGNOSIS — Z23 Encounter for immunization: Secondary | ICD-10-CM | POA: Diagnosis not present

## 2022-12-16 DIAGNOSIS — E782 Mixed hyperlipidemia: Secondary | ICD-10-CM | POA: Diagnosis not present

## 2022-12-16 DIAGNOSIS — M5136 Other intervertebral disc degeneration, lumbar region with discogenic back pain only: Secondary | ICD-10-CM | POA: Diagnosis not present

## 2022-12-16 DIAGNOSIS — K5100 Ulcerative (chronic) pancolitis without complications: Secondary | ICD-10-CM | POA: Diagnosis not present

## 2022-12-16 DIAGNOSIS — T466X5A Adverse effect of antihyperlipidemic and antiarteriosclerotic drugs, initial encounter: Secondary | ICD-10-CM | POA: Diagnosis not present

## 2022-12-16 DIAGNOSIS — M47816 Spondylosis without myelopathy or radiculopathy, lumbar region: Secondary | ICD-10-CM | POA: Diagnosis not present

## 2022-12-16 DIAGNOSIS — G720 Drug-induced myopathy: Secondary | ICD-10-CM | POA: Diagnosis not present

## 2022-12-16 DIAGNOSIS — Z0000 Encounter for general adult medical examination without abnormal findings: Secondary | ICD-10-CM | POA: Diagnosis not present

## 2022-12-16 DIAGNOSIS — I10 Essential (primary) hypertension: Secondary | ICD-10-CM | POA: Diagnosis not present

## 2022-12-16 DIAGNOSIS — Z79899 Other long term (current) drug therapy: Secondary | ICD-10-CM | POA: Diagnosis not present

## 2022-12-16 DIAGNOSIS — I700 Atherosclerosis of aorta: Secondary | ICD-10-CM | POA: Diagnosis not present

## 2022-12-16 DIAGNOSIS — M8580 Other specified disorders of bone density and structure, unspecified site: Secondary | ICD-10-CM | POA: Diagnosis not present

## 2022-12-16 DIAGNOSIS — M5137 Other intervertebral disc degeneration, lumbosacral region with discogenic back pain only: Secondary | ICD-10-CM | POA: Diagnosis not present

## 2023-01-05 DIAGNOSIS — M51362 Other intervertebral disc degeneration, lumbar region with discogenic back pain and lower extremity pain: Secondary | ICD-10-CM | POA: Diagnosis not present

## 2023-01-05 DIAGNOSIS — M51372 Other intervertebral disc degeneration, lumbosacral region with discogenic back pain and lower extremity pain: Secondary | ICD-10-CM | POA: Diagnosis not present

## 2023-01-05 DIAGNOSIS — M9904 Segmental and somatic dysfunction of sacral region: Secondary | ICD-10-CM | POA: Diagnosis not present

## 2023-01-05 DIAGNOSIS — M25562 Pain in left knee: Secondary | ICD-10-CM | POA: Diagnosis not present

## 2023-01-05 DIAGNOSIS — M9903 Segmental and somatic dysfunction of lumbar region: Secondary | ICD-10-CM | POA: Diagnosis not present

## 2023-01-05 DIAGNOSIS — M7918 Myalgia, other site: Secondary | ICD-10-CM | POA: Diagnosis not present

## 2023-01-05 DIAGNOSIS — M5417 Radiculopathy, lumbosacral region: Secondary | ICD-10-CM | POA: Diagnosis not present

## 2023-01-08 DIAGNOSIS — M9903 Segmental and somatic dysfunction of lumbar region: Secondary | ICD-10-CM | POA: Diagnosis not present

## 2023-01-08 DIAGNOSIS — M5417 Radiculopathy, lumbosacral region: Secondary | ICD-10-CM | POA: Diagnosis not present

## 2023-01-08 DIAGNOSIS — M51372 Other intervertebral disc degeneration, lumbosacral region with discogenic back pain and lower extremity pain: Secondary | ICD-10-CM | POA: Diagnosis not present

## 2023-01-08 DIAGNOSIS — M51362 Other intervertebral disc degeneration, lumbar region with discogenic back pain and lower extremity pain: Secondary | ICD-10-CM | POA: Diagnosis not present

## 2023-01-08 DIAGNOSIS — M7918 Myalgia, other site: Secondary | ICD-10-CM | POA: Diagnosis not present

## 2023-01-08 DIAGNOSIS — M9904 Segmental and somatic dysfunction of sacral region: Secondary | ICD-10-CM | POA: Diagnosis not present

## 2023-01-08 DIAGNOSIS — M25562 Pain in left knee: Secondary | ICD-10-CM | POA: Diagnosis not present

## 2023-01-12 DIAGNOSIS — M51362 Other intervertebral disc degeneration, lumbar region with discogenic back pain and lower extremity pain: Secondary | ICD-10-CM | POA: Diagnosis not present

## 2023-01-12 DIAGNOSIS — M51372 Other intervertebral disc degeneration, lumbosacral region with discogenic back pain and lower extremity pain: Secondary | ICD-10-CM | POA: Diagnosis not present

## 2023-01-12 DIAGNOSIS — M25562 Pain in left knee: Secondary | ICD-10-CM | POA: Diagnosis not present

## 2023-01-12 DIAGNOSIS — M9903 Segmental and somatic dysfunction of lumbar region: Secondary | ICD-10-CM | POA: Diagnosis not present

## 2023-01-12 DIAGNOSIS — M5417 Radiculopathy, lumbosacral region: Secondary | ICD-10-CM | POA: Diagnosis not present

## 2023-01-12 DIAGNOSIS — M7918 Myalgia, other site: Secondary | ICD-10-CM | POA: Diagnosis not present

## 2023-01-12 DIAGNOSIS — M9904 Segmental and somatic dysfunction of sacral region: Secondary | ICD-10-CM | POA: Diagnosis not present

## 2023-01-14 DIAGNOSIS — M51362 Other intervertebral disc degeneration, lumbar region with discogenic back pain and lower extremity pain: Secondary | ICD-10-CM | POA: Diagnosis not present

## 2023-01-14 DIAGNOSIS — M51372 Other intervertebral disc degeneration, lumbosacral region with discogenic back pain and lower extremity pain: Secondary | ICD-10-CM | POA: Diagnosis not present

## 2023-01-14 DIAGNOSIS — M7918 Myalgia, other site: Secondary | ICD-10-CM | POA: Diagnosis not present

## 2023-01-14 DIAGNOSIS — M25562 Pain in left knee: Secondary | ICD-10-CM | POA: Diagnosis not present

## 2023-01-14 DIAGNOSIS — M9903 Segmental and somatic dysfunction of lumbar region: Secondary | ICD-10-CM | POA: Diagnosis not present

## 2023-01-14 DIAGNOSIS — M5417 Radiculopathy, lumbosacral region: Secondary | ICD-10-CM | POA: Diagnosis not present

## 2023-01-14 DIAGNOSIS — M9904 Segmental and somatic dysfunction of sacral region: Secondary | ICD-10-CM | POA: Diagnosis not present

## 2023-01-18 DIAGNOSIS — I10 Essential (primary) hypertension: Secondary | ICD-10-CM | POA: Diagnosis not present

## 2023-01-18 DIAGNOSIS — M25562 Pain in left knee: Secondary | ICD-10-CM | POA: Diagnosis not present

## 2023-01-18 DIAGNOSIS — M9904 Segmental and somatic dysfunction of sacral region: Secondary | ICD-10-CM | POA: Diagnosis not present

## 2023-01-18 DIAGNOSIS — M5417 Radiculopathy, lumbosacral region: Secondary | ICD-10-CM | POA: Diagnosis not present

## 2023-01-18 DIAGNOSIS — M9903 Segmental and somatic dysfunction of lumbar region: Secondary | ICD-10-CM | POA: Diagnosis not present

## 2023-01-18 DIAGNOSIS — M7918 Myalgia, other site: Secondary | ICD-10-CM | POA: Diagnosis not present

## 2023-01-20 DIAGNOSIS — M51372 Other intervertebral disc degeneration, lumbosacral region with discogenic back pain and lower extremity pain: Secondary | ICD-10-CM | POA: Diagnosis not present

## 2023-01-20 DIAGNOSIS — M51362 Other intervertebral disc degeneration, lumbar region with discogenic back pain and lower extremity pain: Secondary | ICD-10-CM | POA: Diagnosis not present

## 2023-01-20 DIAGNOSIS — M9904 Segmental and somatic dysfunction of sacral region: Secondary | ICD-10-CM | POA: Diagnosis not present

## 2023-01-20 DIAGNOSIS — M25562 Pain in left knee: Secondary | ICD-10-CM | POA: Diagnosis not present

## 2023-01-20 DIAGNOSIS — M9903 Segmental and somatic dysfunction of lumbar region: Secondary | ICD-10-CM | POA: Diagnosis not present

## 2023-01-20 DIAGNOSIS — M5417 Radiculopathy, lumbosacral region: Secondary | ICD-10-CM | POA: Diagnosis not present

## 2023-01-20 DIAGNOSIS — M7918 Myalgia, other site: Secondary | ICD-10-CM | POA: Diagnosis not present

## 2023-01-26 DIAGNOSIS — M25562 Pain in left knee: Secondary | ICD-10-CM | POA: Diagnosis not present

## 2023-01-26 DIAGNOSIS — M51372 Other intervertebral disc degeneration, lumbosacral region with discogenic back pain and lower extremity pain: Secondary | ICD-10-CM | POA: Diagnosis not present

## 2023-01-26 DIAGNOSIS — M51362 Other intervertebral disc degeneration, lumbar region with discogenic back pain and lower extremity pain: Secondary | ICD-10-CM | POA: Diagnosis not present

## 2023-01-26 DIAGNOSIS — M9903 Segmental and somatic dysfunction of lumbar region: Secondary | ICD-10-CM | POA: Diagnosis not present

## 2023-01-26 DIAGNOSIS — M7918 Myalgia, other site: Secondary | ICD-10-CM | POA: Diagnosis not present

## 2023-01-26 DIAGNOSIS — M9904 Segmental and somatic dysfunction of sacral region: Secondary | ICD-10-CM | POA: Diagnosis not present

## 2023-01-26 DIAGNOSIS — M5417 Radiculopathy, lumbosacral region: Secondary | ICD-10-CM | POA: Diagnosis not present

## 2023-01-28 DIAGNOSIS — M5417 Radiculopathy, lumbosacral region: Secondary | ICD-10-CM | POA: Diagnosis not present

## 2023-01-28 DIAGNOSIS — M51362 Other intervertebral disc degeneration, lumbar region with discogenic back pain and lower extremity pain: Secondary | ICD-10-CM | POA: Diagnosis not present

## 2023-01-28 DIAGNOSIS — M7918 Myalgia, other site: Secondary | ICD-10-CM | POA: Diagnosis not present

## 2023-01-28 DIAGNOSIS — M9903 Segmental and somatic dysfunction of lumbar region: Secondary | ICD-10-CM | POA: Diagnosis not present

## 2023-01-28 DIAGNOSIS — M51372 Other intervertebral disc degeneration, lumbosacral region with discogenic back pain and lower extremity pain: Secondary | ICD-10-CM | POA: Diagnosis not present

## 2023-01-28 DIAGNOSIS — M25562 Pain in left knee: Secondary | ICD-10-CM | POA: Diagnosis not present

## 2023-01-28 DIAGNOSIS — M9904 Segmental and somatic dysfunction of sacral region: Secondary | ICD-10-CM | POA: Diagnosis not present

## 2023-02-02 DIAGNOSIS — M51372 Other intervertebral disc degeneration, lumbosacral region with discogenic back pain and lower extremity pain: Secondary | ICD-10-CM | POA: Diagnosis not present

## 2023-02-02 DIAGNOSIS — M9903 Segmental and somatic dysfunction of lumbar region: Secondary | ICD-10-CM | POA: Diagnosis not present

## 2023-02-02 DIAGNOSIS — M7918 Myalgia, other site: Secondary | ICD-10-CM | POA: Diagnosis not present

## 2023-02-02 DIAGNOSIS — M51362 Other intervertebral disc degeneration, lumbar region with discogenic back pain and lower extremity pain: Secondary | ICD-10-CM | POA: Diagnosis not present

## 2023-02-02 DIAGNOSIS — M5417 Radiculopathy, lumbosacral region: Secondary | ICD-10-CM | POA: Diagnosis not present

## 2023-02-02 DIAGNOSIS — M9904 Segmental and somatic dysfunction of sacral region: Secondary | ICD-10-CM | POA: Diagnosis not present

## 2023-02-02 DIAGNOSIS — M25562 Pain in left knee: Secondary | ICD-10-CM | POA: Diagnosis not present
